# Patient Record
Sex: Female | Born: 1935 | Race: White | Hispanic: No | Marital: Married | Smoking: Never smoker
Health system: Southern US, Community
[De-identification: ages and names within clinical notes are randomized; demographics above are authoritative.]

## PROBLEM LIST (undated history)

## (undated) DIAGNOSIS — M5431 Sciatica, right side: Secondary | ICD-10-CM

## (undated) DIAGNOSIS — R079 Chest pain, unspecified: Secondary | ICD-10-CM

## (undated) DIAGNOSIS — K219 Gastro-esophageal reflux disease without esophagitis: Secondary | ICD-10-CM

## (undated) DIAGNOSIS — M4800 Spinal stenosis, site unspecified: Secondary | ICD-10-CM

## (undated) HISTORY — PX: APPENDECTOMY: SHX54

## (undated) HISTORY — PX: SKIN CANCER EXCISION: SHX779

---

## 2000-03-27 ENCOUNTER — Ambulatory Visit (HOSPITAL_COMMUNITY): Admission: RE | Admit: 2000-03-27 | Discharge: 2000-03-27 | Payer: Self-pay | Admitting: Gynecology

## 2000-03-27 ENCOUNTER — Encounter (INDEPENDENT_AMBULATORY_CARE_PROVIDER_SITE_OTHER): Payer: Self-pay | Admitting: Specialist

## 2000-04-01 ENCOUNTER — Ambulatory Visit: Admission: RE | Admit: 2000-04-01 | Discharge: 2000-04-01 | Payer: Self-pay | Admitting: Gynecology

## 2000-04-04 ENCOUNTER — Encounter: Payer: Self-pay | Admitting: Gynecology

## 2000-04-08 ENCOUNTER — Inpatient Hospital Stay (HOSPITAL_COMMUNITY): Admission: RE | Admit: 2000-04-08 | Discharge: 2000-04-11 | Payer: Self-pay | Admitting: Gynecology

## 2000-04-08 ENCOUNTER — Encounter (INDEPENDENT_AMBULATORY_CARE_PROVIDER_SITE_OTHER): Payer: Self-pay

## 2000-04-09 ENCOUNTER — Encounter (INDEPENDENT_AMBULATORY_CARE_PROVIDER_SITE_OTHER): Payer: Self-pay

## 2000-07-22 ENCOUNTER — Encounter: Admission: RE | Admit: 2000-07-22 | Discharge: 2000-07-22 | Payer: Self-pay | Admitting: Gynecology

## 2000-07-22 ENCOUNTER — Encounter: Payer: Self-pay | Admitting: Gynecology

## 2000-08-05 ENCOUNTER — Other Ambulatory Visit: Admission: RE | Admit: 2000-08-05 | Discharge: 2000-08-05 | Payer: Self-pay | Admitting: Gynecology

## 2000-08-26 HISTORY — PX: ABDOMINAL HYSTERECTOMY: SHX81

## 2000-10-08 ENCOUNTER — Other Ambulatory Visit: Admission: RE | Admit: 2000-10-08 | Discharge: 2000-10-08 | Payer: Self-pay | Admitting: Gynecology

## 2000-10-29 ENCOUNTER — Encounter: Payer: Self-pay | Admitting: Gynecology

## 2000-10-29 ENCOUNTER — Encounter: Admission: RE | Admit: 2000-10-29 | Discharge: 2000-10-29 | Payer: Self-pay | Admitting: Gynecology

## 2001-02-02 ENCOUNTER — Other Ambulatory Visit: Admission: RE | Admit: 2001-02-02 | Discharge: 2001-02-02 | Payer: Self-pay | Admitting: Gynecology

## 2001-04-13 ENCOUNTER — Other Ambulatory Visit: Admission: RE | Admit: 2001-04-13 | Discharge: 2001-04-13 | Payer: Self-pay | Admitting: Gynecology

## 2001-07-13 ENCOUNTER — Other Ambulatory Visit: Admission: RE | Admit: 2001-07-13 | Discharge: 2001-07-13 | Payer: Self-pay | Admitting: Gynecology

## 2001-11-02 ENCOUNTER — Encounter: Admission: RE | Admit: 2001-11-02 | Discharge: 2001-11-02 | Payer: Self-pay | Admitting: Gynecology

## 2001-11-02 ENCOUNTER — Encounter: Payer: Self-pay | Admitting: Gynecology

## 2001-11-23 ENCOUNTER — Ambulatory Visit (HOSPITAL_COMMUNITY): Admission: RE | Admit: 2001-11-23 | Discharge: 2001-11-23 | Payer: Self-pay | Admitting: Gynecology

## 2001-11-23 ENCOUNTER — Encounter: Payer: Self-pay | Admitting: Gynecology

## 2002-03-25 ENCOUNTER — Other Ambulatory Visit: Admission: RE | Admit: 2002-03-25 | Discharge: 2002-03-25 | Payer: Self-pay | Admitting: Gynecology

## 2002-03-26 ENCOUNTER — Encounter: Admission: RE | Admit: 2002-03-26 | Discharge: 2002-03-26 | Payer: Self-pay | Admitting: Gynecology

## 2002-03-26 ENCOUNTER — Encounter: Payer: Self-pay | Admitting: Gynecology

## 2002-07-30 ENCOUNTER — Other Ambulatory Visit: Admission: RE | Admit: 2002-07-30 | Discharge: 2002-07-30 | Payer: Self-pay | Admitting: Gynecology

## 2002-08-13 ENCOUNTER — Encounter: Payer: Self-pay | Admitting: Gynecology

## 2002-08-13 ENCOUNTER — Encounter: Admission: RE | Admit: 2002-08-13 | Discharge: 2002-08-13 | Payer: Self-pay | Admitting: Gynecology

## 2003-08-17 ENCOUNTER — Other Ambulatory Visit: Admission: RE | Admit: 2003-08-17 | Discharge: 2003-08-17 | Payer: Self-pay | Admitting: Gynecology

## 2003-08-22 ENCOUNTER — Encounter: Admission: RE | Admit: 2003-08-22 | Discharge: 2003-08-22 | Payer: Self-pay | Admitting: Gynecology

## 2003-08-25 ENCOUNTER — Encounter: Admission: RE | Admit: 2003-08-25 | Discharge: 2003-08-25 | Payer: Self-pay | Admitting: Gynecology

## 2004-10-10 ENCOUNTER — Other Ambulatory Visit: Admission: RE | Admit: 2004-10-10 | Discharge: 2004-10-10 | Payer: Self-pay | Admitting: Gynecology

## 2004-10-23 ENCOUNTER — Encounter: Admission: RE | Admit: 2004-10-23 | Discharge: 2004-10-23 | Payer: Self-pay | Admitting: Gynecology

## 2005-12-10 ENCOUNTER — Encounter: Admission: RE | Admit: 2005-12-10 | Discharge: 2005-12-10 | Payer: Self-pay | Admitting: Gynecology

## 2005-12-17 ENCOUNTER — Other Ambulatory Visit: Admission: RE | Admit: 2005-12-17 | Discharge: 2005-12-17 | Payer: Self-pay | Admitting: Gynecology

## 2007-01-05 ENCOUNTER — Encounter: Admission: RE | Admit: 2007-01-05 | Discharge: 2007-01-05 | Payer: Self-pay | Admitting: Gynecology

## 2007-01-30 ENCOUNTER — Other Ambulatory Visit: Admission: RE | Admit: 2007-01-30 | Discharge: 2007-01-30 | Payer: Self-pay | Admitting: Gynecology

## 2008-02-04 ENCOUNTER — Encounter: Admission: RE | Admit: 2008-02-04 | Discharge: 2008-02-04 | Payer: Self-pay | Admitting: Gynecology

## 2009-02-14 ENCOUNTER — Encounter: Admission: RE | Admit: 2009-02-14 | Discharge: 2009-02-14 | Payer: Self-pay | Admitting: Gynecology

## 2010-04-04 ENCOUNTER — Encounter: Admission: RE | Admit: 2010-04-04 | Discharge: 2010-04-04 | Payer: Self-pay | Admitting: Gynecology

## 2011-01-11 NOTE — Op Note (Signed)
[facility]  Patient:    Jade Bishop, Jade Bishop                   MRN: [id number] Proc. Date: 04/08/00 Adm. Date:  [id number] Disc. Date: [id number] Attending:  Katrina Stack CC:         Gretta Cool, M.D.  Telford Nab, R.N.   Operative Report  PREOPERATIVE DIAGNOSIS:       Anaplastic endometrial carcinoma.  POSTOPERATIVE DIAGNOSIS:      Anaplastic endometrial carcinoma.  OPERATION:                    Exploratory laparotomy, total abdominal                               hysterectomy, pelvic and periaortic                               lymphadenectomy.  SURGEON:                      Daniel L. Clarke-Pearson, M.D.                               Gretta Cool, M.D.  ANESTHESIA:                   General endotracheal tube  ESTIMATED BLOOD LOSS:         200 cc  SURGICAL FINDINGS:            A exploratory laparotomy, the upper abdomen including the diaphragm, liver, stomach, spleen, omentum, small and large bowel all appear normal.  The patient had previously had an appendectomy.  The pelvic and periaortic lymph nodes appeared normal in size.  The uterus was essentially normal in size. The tubes and ovaries appeared normal as well. On frozen section the pathologist was unable to identify any residual cancer in two frozen section slides of the uterus revealed no evidence of invasion.  DESCRIPTION OF PROCEDURE:     The patient was brought to the operating room and after satisfactory obtainment of general anesthesia was placed in a modified lithotomy position in [place] stirrups.  The anterior abdominal wall, perineum and vagina were prepped with Betadine.  A Foley catheter was placed and the patient was draped.  The abdomen was entered through a midline incision. Peritoneal washings were obtained from the pelvis.  The upper abdomen was explored with the above noted findings.  The Buchwalter retractor was positioned and the bowel was packed  out of the pelvis.  There were some adhesions from the left pelvic side wall to the uterus which were lysed with sharp dissection. The round ligaments were divided and the retroperitoneal space was opened, identifying the external iliac artery and vein, internal iliac artery and ureter.  The ovarian vessels were skeletonized, clamped, cut, suture ligated and free tied.  The bladder flap was advanced with sharp and blunt dissection. The uterine vessels were skeletonized.  Then clamp, cut and suture ligated.  The rectovaginal septum was developed.  In a step-wise fashion, the paracervical and cardinal ligaments were clamped, cut and suture ligated incorporating the uterosacral ligament with a clamp placed across the vaginal angles.  The vagina was transected from its connection to the cervix. The uterus, cervix, tubes  and ovaries were handed off the operative field as a single specimen and submitted to frozen section with the above noted findings. Vaginal angles were transfixed and central portion of the vaginal cuff including the rectovaginal septum was closed with interrupted figure-of-eight sutures of 0 Vicryl.  Attention was turned to the pelvic lymphadenopathy.  The lymph nodes were harvested from the external iliac artery and vein down into the obturator space.  Throughout the dissection the genitofemoral nerve and the obturator nerve were identified and protected.  Samples were sent. Lymphadenectomy was performed of the external iliac, obturator and hypogastric groups.  Hemostasis was achieved with the cautery and hemoclips.  A similar procedure was performed on both sides of the pelvis.  Buchwalter retractor was repositioned.  The small bowel was mobilized to the right and a peritoneal incision was made overlying the right common iliac artery and along the aorta.  The right ureter was identified and reflected laterally.  The duodenum was identified and mobilized cephalad so that  the dissection proceeded to approximately 5 cm above the inferior mesenteric artery.  Lymphadenectomy was then performed removing lymph nodes from the right common iliac artery and vein, vena cava and aorta.  The inferior mesenteric artery was protected throughout the dissection.  Hemostasis was achieved with electrocautery and Hemoclips.  The periaortic dissection was reinspected.  Hemostasis was adequate.  The pelvis was reinspected.  Additional hemostasis was achieved with Hemoclips and cautery.  Pelvis and abdomen were irrigated with copious amounts of warm saline.  The retractors and packed were removed and the anterior abdominal wall closed in layers, the first being a running Smead-Jones closure using #1 PDS.  Subcutaneous tissue was irrigated. Hemostasis achieved with cautery and the skin was closed with skin staples.  Dressing was applied.  The patient was awakened from anesthesia and taken to the recovery room in satisfactory condition. Sponge, needle and instrument counts times two. DD:  04/08/00 TD:  04/08/00 Job: [id number]
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
## 2011-01-11 NOTE — Op Note (Signed)
[facility]  Patient:    Jade Bishop, Jade Bishop                         MRN: [id number] Proc. Date: 03/27/00 Attending:  Gretta Cool, M.D.                           Operative Report  PREOPERATIVE DIAGNOSES: 1. Postmenopausal bleeding. 2. Enormous endometrial polyp versus fibroid by ultrasound.  POSTOPERATIVE DIAGNOSIS:  Two large endometrial polyps attached to the anterior uterine wall.  PROCEDURE:  Hysteroscopy, resection of enormous endometrial polyps, approximately 3 to 4 cm in maximum dimension, total endometrial resection for ablation.  SURGEON:  Gretta Cool, M.D.  ANESTHESIA:  IV sedation and paracervical block.  DESCRIPTION OF PROCEDURE:  Under excellent IV sedation with paracervical block, the cervix was progressively dilated to accommodate a 7 mm resectoscope. The endometrial cavity was then photographed and a very large, approximately silver dollar-sized polyp was noted.  It had very large vessels coursing across it.  The tissue was soft and mushy.  A secondary polyp was noted from the right cornual area.  The resection of the smaller polyp was undertaken first.  Next, the entire enormous endometrial polyp was resected progressively until the entire polyp had been removed to its base.  Its attachment was on the anterior uterine wall.  It had filmy adhesions to the entire uterine wall. Once the polyp was entirely resected and its base resected down into the myometrium, the entire remainder of the endometrial cavity was resected and then the cavity treated by VaporTrode so as to sterilize the endometrial cavity and prevent recurrent polyp formation.  At this point, the procedure was terminated without complication.  The patient returned to the recovery room in excellent condition.  Fluid deficit was approximately 100 cc.  Complications none.  The patient tolerated the procedure without discomfort. DD:  03/27/00 TD:  03/27/00 Job:  [id number]

## 2011-01-11 NOTE — Consult Note (Signed)
[facility]  Patient:    Jade Bishop, Jade Bishop                         MRN: [id number] Adm. Date:  [id number] Attending:  Jeannette Corpus CC:         Gretta Cool, M.D.  Telford Nab, R.N.   Consultation Report  HISTORY OF PRESENT ILLNESS:  A 75 year old white female referred by Gretta Cool, M.D. for evaluation and management of a newly diagnosed anaplastic carcinoma of the endometrium. The patient initially presented to Gretta Cool, M.D. with a vaginal discharge which was pink in nature. She ultimately underwent a D&C on August 2 which returned showing an anaplastic carcinoma which is thought most likely to be an adenosquamous carcinoma although a carcinosarcoma could not be entirely excluded.  The patient has no significant past gynecologic history.  MEDICAL ILLNESSES:  Migraine headaches.  PAST SURGICAL HISTORY:  D&C, exploratory laparotomy with appendectomy for postpartum peritonitis.  CURRENT MEDICATIONS:  None. The patient has never taken any hormone replacement therapy.  FAMILY HISTORY:  Negative for gynecologic, breast, or colon cancers.  SOCIAL HISTORY:  The patient is married. She has three living children. She is retired. She does not smoke.  REVIEW OF SYSTEMS:  Essentially negative, except for a vaginal discharge.  PHYSICAL EXAMINATION:  VITAL SIGNS:  Height 5 feet 2 inches, weight 176 pounds, blood pressure 110/60, pulse 60, respiratory rate 24.  GENERAL:  The patient is a pleasant, white female in no acute distress.  HEENT:  Negative.  NECK:  Supple without thyromegaly.  LYMPH NODES:  No supraclavicular, axillary, or inguinal adenopathy.  ABDOMEN:  Soft, nontender. No mass, organomegaly, ascites, or hernias are noted.  PELVIC:  EG and BUS normal. Vagina has some mucoid discharge present mixed with blood. The uterus is anterior and upper limits normal size. No adnexal mass is noted. Rectovaginal  confirms.  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  DISPOSITION:  I would recommend the patient undergo exploratory laparotomy with total abdominal hysterectomy, bilateral salpingo-oophorectomy, surgical staging to include pelvic and periaortic lymphadenectomy. I had a lengthy discussion with the patient and her husband regarding this management, and the possibility that she would require postoperative radiation therapy and/or chemotherapy. She wishes to proceed with surgery which is scheduled for next week. The risks of surgery including hemorrhage, infection, injury to adjacent viscera, and thromboembolic complications, and anesthetic risks were all outlined to the patient. All of her questions are answered. She wishes to proceed with surgery as scheduled in conjunction with Gretta Cool, M.D. DD:  04/01/00 TD:  04/01/00 Job: [id number]

## 2011-01-11 NOTE — Discharge Summary (Signed)
[facility]  Patient:    Jade Bishop, Jade Bishop                         MRN: [id number] Adm. Date:  04/08/00 Disc. Date: 04/11/00 Attending:  Gretta Cool, M.D. Dictator:   Jeani Sow, RN, FNP CC:         Rande Brunt. Clarke-Pearson, M.D.   Discharge Summary  HISTORY OF PRESENT ILLNESS:  Ms. Offutt is a 75 year old white married female, gravida 3, para 3, who is admitted for definitive therapy of anaplastic carcinoma of the uterus.  She presented to our office with the complaint of yellowish vaginal discharge in May 2001.  Ultrasound examination revealed a very thick endometrium and a questionable endometrial polyp.  She underwent D&C and hysteroscopy as an outpatient at [facility]. Surgery was postponed due to a death in her family, and the procedure was performed on August 2, which showed adenocarcinoma, squamous carcinoma versus carcinoma sarcoma.  Due to the exceedingly poor differentiation, decision could not be made with certainty that it was a necrotic endometrial polyp versus the above findings.  At this point, it was decided to proceed with exploratory laparotomy, hysterectomy, bilateral salpingo-oophorectomy, node dissection, pelvic and periaortic lymphadenectomy.  She is now admitted for these procedures.  Risks and benefits were discussed with the patient.  ADMISSION PHYSICAL EXAMINATION:  CHEST:  Clear to auscultation and percussion.  HEART:  Regular rate and rhythm without murmur, gallop, or cardiac enlargement.  ABDOMEN:  Soft without masses or organomegaly.  PELVIC:  External genitalia within normal limits, female vagina clean with significant atrophy.  Cervix is parous.  Uterus upper normal limit of size. Adnexa bilaterally clear.  Rectovaginal exam confirms.  IMPRESSION:  Anaplastic carcinoma of the endometrium in a menopausal woman without hormone replacement therapy.  PROCEDURES:  As mentioned above.  LABORATORY DATA:   Admission hemoglobin 13.7, hematocrit 38.7, and a white count of 6.2.  On the first postoperative day, hemoglobin was 11.7, hematocrit 32.9, and a white count of 8.2.  Admission glucose was 141.  Blood type O positive with a negative antibody screen.  Chest x-ray with heart size prominent, no active disease.  HOSPITAL COURSE:  The patient underwent exploratory laparotomy, total abdominal hysterectomy, pelvic and periaortic lymphadenectomy under general anesthesia.  The procedures were performed by Dr. De Blanch and Dr. Beather Arbour, and they were completed without any complications. Pathology report revealed extensive ulceration, hemorrhage, and foreign body reaction consistent with recent hysteroscopic procedure of the endometrium, cervix with no pathologic abnormalities, myometrium and serosa with no pathological abnormalities, bilateral ovaries and tubes with no abnormalities identified.  All nodes were benign.  Her postoperative course was without complications, and the patient was discharged on the third postoperative day in excellent condition.  DISCHARGE INSTRUCTIONS:  No heavy lifting or straining, no vaginal entrance, increase ambulation as tolerated.  She is to call for any fever of over 105 or failure of daily improvement.  DIET:  Regular.  DISCHARGE MEDICATIONS:  Vioxx 25 mg 1 p.o. q.12-24h. p.r.n.  FOLLOW-UP:  She is to return to the office in one week for follow-up.  CONDITION ON DISCHARGE:  Excellent.  FINAL DISCHARGE DIAGNOSES:  Anaplastic endometrial carcinoma.  PROCEDURES PERFORMED:  Exploratory laparotomy, total abdominal hysterectomy, pelvic and periaortic lymphadenectomy under general anesthesia. DD:  05/12/00 TD:  05/13/00 Job: [id number]

## 2011-04-17 ENCOUNTER — Other Ambulatory Visit: Payer: Self-pay | Admitting: Gynecology

## 2011-04-17 DIAGNOSIS — Z1231 Encounter for screening mammogram for malignant neoplasm of breast: Secondary | ICD-10-CM

## 2011-04-25 ENCOUNTER — Ambulatory Visit
Admission: RE | Admit: 2011-04-25 | Discharge: 2011-04-25 | Disposition: A | Discharge status: Home / Self Care | Payer: Medicare Other | Source: Ambulatory Visit | Attending: Gynecology | Admitting: Gynecology

## 2011-04-25 DIAGNOSIS — Z1231 Encounter for screening mammogram for malignant neoplasm of breast: Secondary | ICD-10-CM

## 2011-06-04 ENCOUNTER — Other Ambulatory Visit: Payer: Self-pay | Admitting: Gynecology

## 2012-05-07 ENCOUNTER — Other Ambulatory Visit: Payer: Self-pay | Admitting: Gynecology

## 2012-05-07 DIAGNOSIS — Z1231 Encounter for screening mammogram for malignant neoplasm of breast: Secondary | ICD-10-CM

## 2012-06-01 ENCOUNTER — Ambulatory Visit: Payer: Medicare Other

## 2012-06-03 ENCOUNTER — Ambulatory Visit: Payer: Medicare Other

## 2012-06-08 ENCOUNTER — Other Ambulatory Visit: Payer: Self-pay | Admitting: Gynecology

## 2012-06-18 ENCOUNTER — Ambulatory Visit: Payer: Medicare Other

## 2012-07-29 ENCOUNTER — Ambulatory Visit
Admission: RE | Admit: 2012-07-29 | Discharge: 2012-07-29 | Disposition: A | Discharge status: Home / Self Care | Payer: Medicare Other | Source: Ambulatory Visit | Attending: Gynecology | Admitting: Gynecology

## 2012-07-29 DIAGNOSIS — Z1231 Encounter for screening mammogram for malignant neoplasm of breast: Secondary | ICD-10-CM

## 2013-08-13 ENCOUNTER — Other Ambulatory Visit: Payer: Self-pay

## 2013-08-13 DIAGNOSIS — Z1231 Encounter for screening mammogram for malignant neoplasm of breast: Secondary | ICD-10-CM

## 2013-08-24 ENCOUNTER — Ambulatory Visit
Admission: RE | Admit: 2013-08-24 | Discharge: 2013-08-24 | Disposition: A | Discharge status: Home / Self Care | Payer: Medicare HMO | Source: Ambulatory Visit

## 2013-08-24 DIAGNOSIS — Z1231 Encounter for screening mammogram for malignant neoplasm of breast: Secondary | ICD-10-CM

## 2013-09-13 ENCOUNTER — Other Ambulatory Visit (HOSPITAL_COMMUNITY): Payer: Self-pay | Admitting: Neurosurgery

## 2013-09-13 ENCOUNTER — Ambulatory Visit (HOSPITAL_COMMUNITY)
Admission: RE | Admit: 2013-09-13 | Discharge: 2013-09-13 | Disposition: A | Discharge status: Home / Self Care | Payer: Medicare HMO | Source: Ambulatory Visit | Attending: Neurosurgery | Admitting: Neurosurgery

## 2013-09-13 DIAGNOSIS — Q762 Congenital spondylolisthesis: Secondary | ICD-10-CM

## 2013-09-13 DIAGNOSIS — M79609 Pain in unspecified limb: Secondary | ICD-10-CM

## 2013-09-13 NOTE — Progress Notes (Signed)
*  Preliminary Results* Bilateral lower extremity venous duplex completed. Bilateral lower extremities are negative for deep vein thrombosis. There is no evidence of Baker's cyst bilaterally.  09/13/2013  Gertie FeyMichelle Zlatan Hornback, RVT, RDCS, RDMS

## 2014-05-04 ENCOUNTER — Other Ambulatory Visit: Payer: Self-pay | Admitting: Dermatology

## 2014-09-08 ENCOUNTER — Other Ambulatory Visit: Payer: Self-pay

## 2014-09-08 DIAGNOSIS — Z1231 Encounter for screening mammogram for malignant neoplasm of breast: Secondary | ICD-10-CM

## 2014-09-15 ENCOUNTER — Ambulatory Visit
Admission: RE | Admit: 2014-09-15 | Discharge: 2014-09-15 | Disposition: A | Discharge status: Home / Self Care | Payer: Commercial Managed Care - HMO | Source: Ambulatory Visit

## 2014-09-15 DIAGNOSIS — Z1231 Encounter for screening mammogram for malignant neoplasm of breast: Secondary | ICD-10-CM

## 2015-08-09 ENCOUNTER — Other Ambulatory Visit: Payer: Self-pay

## 2015-08-09 DIAGNOSIS — Z1231 Encounter for screening mammogram for malignant neoplasm of breast: Secondary | ICD-10-CM

## 2015-09-18 ENCOUNTER — Ambulatory Visit
Admission: RE | Admit: 2015-09-18 | Discharge: 2015-09-18 | Disposition: A | Discharge status: Home / Self Care | Payer: Commercial Managed Care - HMO | Source: Ambulatory Visit

## 2015-09-18 DIAGNOSIS — Z1231 Encounter for screening mammogram for malignant neoplasm of breast: Secondary | ICD-10-CM

## 2016-07-03 ENCOUNTER — Encounter (HOSPITAL_COMMUNITY): Payer: Self-pay | Admitting: Emergency Medicine

## 2016-07-03 ENCOUNTER — Emergency Department (HOSPITAL_COMMUNITY): Payer: Commercial Managed Care - HMO

## 2016-07-03 ENCOUNTER — Observation Stay (HOSPITAL_COMMUNITY)
Admission: EM | Admit: 2016-07-03 | Discharge: 2016-07-04 | Disposition: A | Discharge status: Home / Self Care | Payer: Commercial Managed Care - HMO | Attending: Internal Medicine | Admitting: Internal Medicine

## 2016-07-03 DIAGNOSIS — R600 Localized edema: Secondary | ICD-10-CM | POA: Insufficient documentation

## 2016-07-03 DIAGNOSIS — K210 Gastro-esophageal reflux disease with esophagitis: Secondary | ICD-10-CM | POA: Diagnosis not present

## 2016-07-03 DIAGNOSIS — R1310 Dysphagia, unspecified: Secondary | ICD-10-CM

## 2016-07-03 DIAGNOSIS — R071 Chest pain on breathing: Secondary | ICD-10-CM | POA: Diagnosis present

## 2016-07-03 DIAGNOSIS — Z79899 Other long term (current) drug therapy: Secondary | ICD-10-CM | POA: Diagnosis not present

## 2016-07-03 DIAGNOSIS — R079 Chest pain, unspecified: Secondary | ICD-10-CM | POA: Diagnosis not present

## 2016-07-03 DIAGNOSIS — Z7982 Long term (current) use of aspirin: Secondary | ICD-10-CM | POA: Insufficient documentation

## 2016-07-03 DIAGNOSIS — K219 Gastro-esophageal reflux disease without esophagitis: Secondary | ICD-10-CM

## 2016-07-03 DIAGNOSIS — M791 Myalgia: Secondary | ICD-10-CM

## 2016-07-03 DIAGNOSIS — R739 Hyperglycemia, unspecified: Secondary | ICD-10-CM

## 2016-07-03 DIAGNOSIS — M7918 Myalgia, other site: Secondary | ICD-10-CM

## 2016-07-03 HISTORY — DX: Gastro-esophageal reflux disease without esophagitis: K21.9

## 2016-07-03 HISTORY — DX: Sciatica, right side: M54.31

## 2016-07-03 HISTORY — DX: Spinal stenosis, site unspecified: M48.00

## 2016-07-03 HISTORY — DX: Chest pain, unspecified: R07.9

## 2016-07-03 LAB — I-STAT TROPONIN, ED
TROPONIN I, POC: 0 ng/mL (ref 0.00–0.08)
TROPONIN I, POC: 0 ng/mL (ref 0.00–0.08)

## 2016-07-03 LAB — I-STAT CHEM 8, ED
BUN: 13 mg/dL (ref 6–20)
CALCIUM ION: 1.13 mmol/L — AB (ref 1.15–1.40)
CHLORIDE: 104 mmol/L (ref 101–111)
CREATININE: 0.9 mg/dL (ref 0.44–1.00)
GLUCOSE: 116 mg/dL — AB (ref 65–99)
HCT: 41 % (ref 36.0–46.0)
Hemoglobin: 13.9 g/dL (ref 12.0–15.0)
Potassium: 3.9 mmol/L (ref 3.5–5.1)
Sodium: 143 mmol/L (ref 135–145)
TCO2: 27 mmol/L (ref 0–100)

## 2016-07-03 LAB — TROPONIN I
Troponin I: <0.03 ng/mL (ref <0.03)
Troponin I: <0.03 ng/mL (ref <0.03)

## 2016-07-03 LAB — COMPREHENSIVE METABOLIC PANEL
ALBUMIN: 3.8 g/dL (ref 3.5–5.0)
ALT: 27 U/L (ref 14–54)
AST: 30 U/L (ref 15–41)
Alkaline Phosphatase: 62 U/L (ref 38–126)
Anion gap: 9 (ref 5–15)
BILIRUBIN TOTAL: 0.8 mg/dL (ref 0.3–1.2)
BUN: 12 mg/dL (ref 6–20)
CO2: 26 mmol/L (ref 22–32)
Calcium: 9.4 mg/dL (ref 8.9–10.3)
Chloride: 107 mmol/L (ref 101–111)
Creatinine, Ser: 0.89 mg/dL (ref 0.44–1.00)
GFR calc Af Amer: >60 mL/min (ref >60)
GFR calc non Af Amer: >60 mL/min (ref >60)
GLUCOSE: 123 mg/dL — AB (ref 65–99)
POTASSIUM: 3.9 mmol/L (ref 3.5–5.1)
SODIUM: 142 mmol/L (ref 135–145)
TOTAL PROTEIN: 6.5 g/dL (ref 6.5–8.1)

## 2016-07-03 LAB — CBC
HCT: 41.2 % (ref 36.0–46.0)
HEMOGLOBIN: 14 g/dL (ref 12.0–15.0)
MCH: 30.3 pg (ref 26.0–34.0)
MCHC: 34 g/dL (ref 30.0–36.0)
MCV: 89.2 fL (ref 78.0–100.0)
Platelets: 212 10*3/uL (ref 150–400)
RBC: 4.62 MIL/uL (ref 3.87–5.11)
RDW: 12.9 % (ref 11.5–15.5)
WBC: 12.1 10*3/uL — ABNORMAL HIGH (ref 4.0–10.5)

## 2016-07-03 MED ORDER — ONDANSETRON HCL 4 MG/2ML IJ SOLN: 4.0000 mg | Freq: Four times a day (QID) | INTRAMUSCULAR | Status: DC | PRN

## 2016-07-03 MED ORDER — MORPHINE SULFATE (PF) 4 MG/ML IV SOLN
4.0000 mg | Freq: Once | INTRAVENOUS | Status: AC
  Administered 2016-07-03: 4 mg via INTRAVENOUS
  Filled 2016-07-03: qty 1

## 2016-07-03 MED ORDER — ACETAMINOPHEN 650 MG RE SUPP: 650.0000 mg | Freq: Four times a day (QID) | RECTAL | Status: DC | PRN

## 2016-07-03 MED ORDER — IOPAMIDOL (ISOVUE-370) INJECTION 76%
INTRAVENOUS | Status: AC
  Administered 2016-07-03: 100 mL
  Filled 2016-07-03: qty 100

## 2016-07-03 MED ORDER — METHOCARBAMOL 1000 MG/10ML IJ SOLN
500.0000 mg | Freq: Four times a day (QID) | INTRAVENOUS | Status: DC | PRN
  Filled 2016-07-03: qty 5

## 2016-07-03 MED ORDER — KETOTIFEN FUMARATE 0.025 % OP SOLN
1.0000 [drp] | Freq: Two times a day (BID) | OPHTHALMIC | Status: DC | PRN
  Filled 2016-07-03: qty 5

## 2016-07-03 MED ORDER — ONDANSETRON HCL 4 MG PO TABS: 4.0000 mg | ORAL_TABLET | Freq: Four times a day (QID) | ORAL | Status: DC | PRN

## 2016-07-03 MED ORDER — HEPARIN SODIUM (PORCINE) 5000 UNIT/ML IJ SOLN
5000.0000 [IU] | Freq: Three times a day (TID) | INTRAMUSCULAR | Status: DC
  Administered 2016-07-03 – 2016-07-04 (×2): 5000 [IU] via SUBCUTANEOUS
  Filled 2016-07-03 (×2): qty 1

## 2016-07-03 MED ORDER — METHOCARBAMOL 500 MG PO TABS
500.0000 mg | ORAL_TABLET | Freq: Four times a day (QID) | ORAL | Status: DC | PRN
  Administered 2016-07-03: 500 mg via ORAL
  Filled 2016-07-03: qty 1

## 2016-07-03 MED ORDER — DICLOFENAC SODIUM 1 % TD GEL
4.0000 g | Freq: Four times a day (QID) | TRANSDERMAL | Status: DC | PRN
  Filled 2016-07-03: qty 100

## 2016-07-03 MED ORDER — GI COCKTAIL ~~LOC~~
30.0000 mL | Freq: Once | ORAL | Status: AC
  Administered 2016-07-03: 30 mL via ORAL
  Filled 2016-07-03: qty 30

## 2016-07-03 MED ORDER — GI COCKTAIL ~~LOC~~
30.0000 mL | Freq: Four times a day (QID) | ORAL | Status: DC | PRN
  Administered 2016-07-04: 30 mL via ORAL
  Filled 2016-07-03: qty 30

## 2016-07-03 MED ORDER — SODIUM CHLORIDE 0.45 % IV SOLN
INTRAVENOUS | Status: DC
  Administered 2016-07-03: 16:00:00 via INTRAVENOUS

## 2016-07-03 MED ORDER — POLYVINYL ALCOHOL 1.4 % OP SOLN
1.0000 [drp] | Freq: Four times a day (QID) | OPHTHALMIC | Status: DC | PRN
  Filled 2016-07-03: qty 15

## 2016-07-03 MED ORDER — ASPIRIN 325 MG PO TABS
325.0000 mg | ORAL_TABLET | Freq: Every day | ORAL | Status: DC
  Administered 2016-07-04: 325 mg via ORAL
  Filled 2016-07-03: qty 1

## 2016-07-03 MED ORDER — ALPRAZOLAM 0.25 MG PO TABS: 0.2500 mg | ORAL_TABLET | Freq: Two times a day (BID) | ORAL | Status: DC | PRN

## 2016-07-03 MED ORDER — SODIUM CHLORIDE 0.9% FLUSH
3.0000 mL | Freq: Two times a day (BID) | INTRAVENOUS | Status: DC
  Administered 2016-07-03: 3 mL via INTRAVENOUS

## 2016-07-03 MED ORDER — ACETAMINOPHEN 325 MG PO TABS: 650.0000 mg | ORAL_TABLET | Freq: Four times a day (QID) | ORAL | Status: DC | PRN

## 2016-07-03 NOTE — ED Notes (Signed)
[facility] called and said that they are unable to do stress test on patient today due to staffing and doctor wanting to see more troponins drawn prior to doing stress test.  They will be able to do stress test tomorrow.  KEEP PATIENT NPO AFTER MIDNIGHT TONIGHT.

## 2016-07-03 NOTE — Consult Note (Signed)
[facility] Consult Note  Referring Provider: No ref. provider found Primary Care Physician:  Melinda Crutch, MD Primary Gastroenterologist:  Dr.  Laurel Dimmer Complaint: Chest pain HPI: Jade Bishop is an 80 y.o. white female  who awoke at 5 AM with crushing substernal chest pain radiating up into the left arm and neck. Had somewhat of a pleuritic component to it and only slightly resembled brief attacks of what she calls reflux. Came to the emergency room had normal troponin and EKG. Was given a GI cocktail with minimal relief but pain later began to subside after a few hours. She had chest CT to rule out PE or aortic dissection this revealed no definite abnormality. She states she has history of reflux and does not take anything regularly for it other than an acids. She also complains a lot of burping and has some intermittent stable solid food dysphagia with occasional forced regurgitation occurring about once a month. She's had a colonoscopy about 9 years ago and was recently called by her gastroenterologist to set up another one but he is not except her insurance.  Past Medical History:  Diagnosis Date  . GERD (gastroesophageal reflux disease)   . Sciatica, right side   . Spinal stenosis     Past Surgical History:  Procedure Laterality Date  . ABDOMINAL HYSTERECTOMY  2002  . APPENDECTOMY    . SKIN CANCER EXCISION     located on abdominal wall     (Not in a hospital admission)  Allergies: Not on File  Family History  Problem Relation Age of Onset  . Stroke Mother 84    Social History:  reports that she has never smoked. She has never used smokeless tobacco. She reports that she does not drink alcohol or use drugs.  Review of Systems: negative except As above  Blood pressure 120/55, pulse 79, temperature 98.7 F (37.1 C), resp. rate 11, height _0  (1.575 m), weight 81.6 kg (180 lb), SpO2 94 %. Head: Normocephalic, without obvious abnormality, atraumatic Neck: no  adenopathy, no carotid bruit, no JVD, supple, symmetrical, trachea midline and thyroid not enlarged, symmetric, no tenderness/mass/nodules Resp: clear to auscultation bilaterally Cardio: regular rate and rhythm, S1, S2 normal, no murmur, click, rub or gallop GI: Abdomen soft nondistended with normoactive bowel sounds. splenomegaly mass or guarding  Extremities: extremities normal, atraumatic, no cyanosis or edema  Results for orders placed or performed during the hospital encounter of 07/03/16 (from the past 48 hour(s))  CBC     Status: Abnormal   Collection Time: 07/03/16  8:45 AM  Result Value Ref Range   WBC 12.1 (H) 4.0 - 10.5 K/uL   RBC 4.62 3.87 - 5.11 MIL/uL   Hemoglobin 14.0 12.0 - 15.0 g/dL   HCT 41.2 36.0 - 46.0 %   MCV 89.2 78.0 - 100.0 fL   MCH 30.3 26.0 - 34.0 pg   MCHC 34.0 30.0 - 36.0 g/dL   RDW 12.9 11.5 - 15.5 %   Platelets 212 150 - 400 K/uL  Comprehensive metabolic panel     Status: Abnormal   Collection Time: 07/03/16  8:45 AM  Result Value Ref Range   Sodium 142 135 - 145 mmol/L   Potassium 3.9 3.5 - 5.1 mmol/L   Chloride 107 101 - 111 mmol/L   CO2 26 22 - 32 mmol/L   Glucose, Bld 123 (H) 65 - 99 mg/dL   BUN 12 6 - 20 mg/dL   Creatinine, Ser 0.89 0.44 - 1.00 mg/dL  Calcium 9.4 8.9 - 10.3 mg/dL   Total Protein 6.5 6.5 - 8.1 g/dL   Albumin 3.8 3.5 - 5.0 g/dL   AST 30 15 - 41 U/L   ALT 27 14 - 54 U/L   Alkaline Phosphatase 62 38 - 126 U/L   Total Bilirubin 0.8 0.3 - 1.2 mg/dL   GFR calc non Af Amer >60 >60 mL/min   GFR calc Af Amer >60 >60 mL/min    Comment: (NOTE) The eGFR has been calculated using the CKD EPI equation. This calculation has not been validated in all clinical situations. eGFR's persistently <60 mL/min signify possible Chronic Kidney Disease.    Anion gap 9 5 - 15  I-stat troponin, ED     Status: None   Collection Time: 07/03/16  9:04 AM  Result Value Ref Range   Troponin i, poc 0.00 0.00 - 0.08 ng/mL   Comment 3            Comment:  Due to the release kinetics of cTnI, a negative result within the first hours of the onset of symptoms does not rule out myocardial infarction with certainty. If myocardial infarction is still suspected, repeat the test at appropriate intervals.   I-stat Chem 8, ED     Status: Abnormal   Collection Time: 07/03/16  9:06 AM  Result Value Ref Range   Sodium 143 135 - 145 mmol/L   Potassium 3.9 3.5 - 5.1 mmol/L   Chloride 104 101 - 111 mmol/L   BUN 13 6 - 20 mg/dL   Creatinine, Ser 0.90 0.44 - 1.00 mg/dL   Glucose, Bld 116 (H) 65 - 99 mg/dL   Calcium, Ion 1.13 (L) 1.15 - 1.40 mmol/L   TCO2 27 0 - 100 mmol/L   Hemoglobin 13.9 12.0 - 15.0 g/dL   HCT 41.0 36.0 - 46.0 %  I-Stat Troponin, ED (not at [facility])     Status: None   Collection Time: 07/03/16  1:40 PM  Result Value Ref Range   Troponin i, poc 0.00 0.00 - 0.08 ng/mL   Comment 3            Comment: Due to the release kinetics of cTnI, a negative result within the first hours of the onset of symptoms does not rule out myocardial infarction with certainty. If myocardial infarction is still suspected, repeat the test at appropriate intervals.   Troponin I-serum (0, 3, 6 hours)     Status: None   Collection Time: 07/03/16  1:50 PM  Result Value Ref Range   Troponin I <0.03 <0.03 ng/mL   Dg Chest Portable 1 View  Result Date: 07/03/2016 CLINICAL DATA:  Chest pain EXAM: PORTABLE CHEST 1 VIEW COMPARISON:  None. FINDINGS: The heart size and mediastinal contours are within normal limits. Both lungs are clear. The visualized skeletal structures are unremarkable. IMPRESSION: No active disease. Electronically Signed   By: Franchot Gallo M.D.   On: 07/03/2016 09:20   Ct Angio Chest/abd/pel For Dissection W And/or Wo Contrast  Result Date: 07/03/2016 CLINICAL DATA:  80 year old female with stabbing central chest pain beginning earlier today at 0500 hours. EXAM: CT ANGIOGRAPHY CHEST, ABDOMEN AND PELVIS TECHNIQUE: Multidetector CT imaging  through the chest, abdomen and pelvis was performed using the standard protocol during bolus administration of intravenous contrast. Multiplanar reconstructed images and MIPs were obtained and reviewed to evaluate the vascular anatomy. CONTRAST:  100 mL Isovue 350 COMPARISON:  None. FINDINGS: CTA CHEST FINDINGS Cardiovascular: Initial non contrasted images demonstrate  no high attenuation material within the aortic wall to suggest the presence of an acute intramural hematoma. There is scant calcification along the course of the left anterior descending coronary artery. Conventional 3 vessel arch anatomy. Preferential opacification of the thoracic aorta. No evidence of thoracic aortic aneurysm or dissection. Borderline cardiomegaly with prominent right atrium. No pericardial effusion. The pulmonary arteries are also relatively well opacified. No evidence of pulmonary embolus to the segmental level. Mediastinum/Nodes: No enlarged mediastinal, hilar, or axillary lymph nodes. Thyroid gland, trachea, and esophagus demonstrate no significant findings. Lungs/Pleura: Minimal dependent atelectasis. Otherwise, the lungs are clear. No pleural effusion or pneumothorax. Musculoskeletal: No acute fracture or aggressive appearing lytic or blastic osseous lesion. Review of the MIP images confirms the above findings. CTA ABDOMEN AND PELVIS FINDINGS VASCULAR Aorta: Normal caliber aorta without aneurysm, dissection, vasculitis or significant stenosis. Celiac: Patent without evidence of aneurysm, dissection, vasculitis or significant stenosis. SMA: Patent without evidence of aneurysm, dissection, vasculitis or significant stenosis. Renals: Solitary right and codominant dual left-sided renal arteries. All 3 renal arteries are widely patent without evidence of significant atherosclerotic plaque or fibromuscular dysplasia. IMA: Patent without evidence of aneurysm, dissection, vasculitis or significant stenosis. Inflow: Patent without  evidence of aneurysm, dissection, vasculitis or significant stenosis. Veins: No obvious venous abnormality within the limitations of this arterial phase study. Review of the MIP images confirms the above findings. NON-VASCULAR Hepatobiliary: Irregularly-shaped lesion along the posterior margin of the right hepatic lobe extending from near the dome inferiorly along segment 6. The region is primarily hypodense with mosaic internal dystrophic calcifications. This likely represents a partially calcified hemangioma, or sequelae of old granulomatous disease, or some other benign process. No additional hepatic lesion is identified. Gallbladder is unremarkable. No intra or extrahepatic biliary ductal dilatation. Pancreas: Unremarkable. No pancreatic ductal dilatation or surrounding inflammatory changes. Spleen: Normal in size without focal abnormality. Adrenals/Urinary Tract: Adrenal glands are unremarkable. Kidneys are normal, without renal calculi, focal lesion, or hydronephrosis. Bladder is unremarkable. Stomach/Bowel: Extensive colonic diverticulosis without evidence of active inflammation to suggest acute diverticulitis. No focal bowel wall thickening or evidence obstruction. Normal appendix in the right lower quadrant. Unremarkable appearance of the stomach and duodenum. Lymphatic: No enlarged or suspicious adenopathy. Surgical clips are present in the retroperitoneum and along both pelvic sidewalls suggesting prior nodal dissection. Reproductive: Surgical changes of prior hysterectomy. No adnexal mass. Other: Small fat containing periumbilical hernia.  Diastases recti. Musculoskeletal: No acute fracture or aggressive appearing lytic or blastic osseous lesion. Mild multilevel degenerative disc disease. Prominent Schmorl's node in the superior endplate of L4. Review of the MIP images confirms the above findings. IMPRESSION: CTA CHEST 1. No evidence of dissection or other acute aortic abnormality. 2. Please note that  although the presence of coronary artery calcium documents the presence of coronary artery disease, the severity of this disease and any potential stenosis cannot be assessed on this non-gated CT examination. Assessment for potential risk factor modification, dietary therapy or pharmacologic therapy may be warranted, if clinically indicated. 3. No evidence of acute pulmonary embolus to the segmental level. 4. Borderline cardiomegaly with mildly enlarged right atrium. CTA ABD/PELVIS 1. No evidence of aortic dissection, aneurysm or other acute aortic abnormality. 2. No other acute abnormality in the abdomen or pelvis to explain the patient's central chest pain. 3. Colonic diverticular disease without CT evidence of active inflammation. 4. Incidental note is made of an irregularly shaped lesion with mosaic internal dystrophic calcifications along the posterior aspect of the liver. This almost  certainly represents either a partially calcified hemangioma, sequelae of old granulomatous disease, or the sequelae of some other benign process. 5. Surgical changes suggest prior retroperitoneal and bilateral pelvic sidewall lymph node dissection. 6. Small fat containing periumbilical hernia. 7. Diastases recti. Signed, Criselda Peaches, MD [facility] Electronically Signed   By: Jacqulynn Cadet M.D.   On: 07/03/2016 11:14    Assessment: Chest pain  Gastroesophageal reflux  Dysphagia  Plan:   cardiac workup  Empiric PPI Will follow with you and plan outpatient EGD/colonoscopy with possible esophageal dilatation at some point.  Siena Poehler C 07/03/2016, 3:16 PM  Pager [phone] If no answer or after 5 PM call [phone]

## 2016-07-03 NOTE — ED Notes (Signed)
Two unsuccessful attempts to give report to floor.

## 2016-07-03 NOTE — ED Provider Notes (Signed)
[facility]-EMERGENCY DEPT Provider Note   CSN: [id number] Arrival date & time: 07/03/16  [id number]     History   Chief Complaint Chief Complaint  Patient presents with  . Chest Pain    HPI Jade Bishop is a 80 y.o. female.  HPI Patient presents with chest pain. Began around 5 this morning. Went to right jaw and left shoulder. Some nausea. No relief nitroglycerin. Slight relief with morphine and Zofran. She is not had pains like this before. No relief with Alka-Seltzer or baking soda. No known coronary artery disease. She does not smoke. She has not had pains like this before. She's been doing well the last few days. Pain is sharp and worse with breathing. Patient appears uncomfortable. Starts in her mid chest and does go down to her abdomen.   History reviewed. No pertinent past medical history.  There are no active problems to display for this patient.   Past Surgical History:  Procedure Laterality Date  . ABDOMINAL HYSTERECTOMY  2002  . APPENDECTOMY      OB History    No data available       Home Medications    Prior to Admission medications   Medication Sig Start Date End Date Taking? Authorizing Provider  aspirin 325 MG tablet Take 325 mg by mouth every 6 (six) hours as needed for mild pain.   Yes Historical Provider, MD  aspirin-sod bicarb-citric acid (ALKA-SELTZER) 325 MG TBEF tablet Take 325 mg by mouth every 6 (six) hours as needed (cold symptoms).   Yes Historical Provider, MD  ibuprofen (ADVIL,MOTRIN) 200 MG tablet Take 200 mg by mouth every 6 (six) hours as needed for moderate pain.   Yes Historical Provider, MD  Polyethyl Glycol-Propyl Glycol (SYSTANE OP) Place 1 drop into the left eye daily.   Yes Historical Provider, MD  Vitamin D, Ergocalciferol, (DRISDOL) 50000 units CAPS capsule Take 1 capsule by mouth once a week. 06/17/16  Yes Historical Provider, MD    Family History No family history on file.  Social History Social History  Substance Use Topics  .  Smoking status: Never Smoker  . Smokeless tobacco: Never Used  . Alcohol use No     Allergies   Patient has no allergy information on record.   Review of Systems Review of Systems  Constitutional: Positive for appetite change.  HENT: Negative for congestion.   Respiratory: Negative for shortness of breath.   Cardiovascular: Positive for chest pain.  Gastrointestinal: Negative for abdominal pain.  Genitourinary: Negative for difficulty urinating.  Musculoskeletal: Negative for back pain.  Skin: Negative for color change.  Neurological: Negative for facial asymmetry and light-headedness.  Hematological: Negative for adenopathy.     Physical Exam Updated Vital Signs BP 124/59   Pulse 79   Temp 98.7 F (37.1 C)   Resp 19   Ht 5\' 2"  (1.575 m)   Wt 180 lb (81.6 kg)   SpO2 (!) 89%   BMI 32.92 kg/m   Physical Exam  Constitutional: She appears well-developed.  Patient appears uncomfortable  HENT:  Head: Atraumatic.  Eyes: EOM are normal.  Neck: No JVD present.  Cardiovascular: Normal rate.   Pulmonary/Chest: No respiratory distress.  Abdominal: There is no tenderness.  Musculoskeletal: She exhibits no edema.  Mild bilateral lower extremity pitting edema.  Neurological: She is alert.  Some chronic left-sided facial droop and difficulty closing left eye.  Skin: Skin is warm.  Psychiatric: She has a normal mood and affect.  ED Treatments / Results  Labs (all labs ordered are listed, but only abnormal results are displayed) Labs Reviewed  CBC - Abnormal; Notable for the following:       Result Value   WBC 12.1 (*)    All other components within normal limits  COMPREHENSIVE METABOLIC PANEL - Abnormal; Notable for the following:    Glucose, Bld 123 (*)    All other components within normal limits  I-STAT CHEM 8, ED - Abnormal; Notable for the following:    Glucose, Bld 116 (*)    Calcium, Ion 1.13 (*)    All other components within normal limits  I-STAT  TROPOININ, ED    EKG  EKG Interpretation  Date/Time:  Wednesday July 03 2016 08:40:52 EST Ventricular Rate:  66 PR Interval:    QRS Duration: 93 QT Interval:  411 QTC Calculation: 431 R Axis:   -16 Text Interpretation:  Sinus rhythm Borderline left axis deviation Low voltage, precordial leads Borderline T wave abnormalities Confirmed by Rubin Payor  MD, Harrold Donath [phone]) on 07/03/2016 8:48:55 AM Also confirmed by Rubin Payor  MD, Delmer Kowalski [phone]), editor [place], [place] [phone])  on 07/03/2016 9:15:06 AM       Radiology Dg Chest Portable 1 View  Result Date: 07/03/2016 CLINICAL DATA:  Chest pain EXAM: PORTABLE CHEST 1 VIEW COMPARISON:  None. FINDINGS: The heart size and mediastinal contours are within normal limits. Both lungs are clear. The visualized skeletal structures are unremarkable. IMPRESSION: No active disease. Electronically Signed   By: Marlan Palau M.D.   On: 07/03/2016 09:20   Ct Angio Chest/abd/pel For Dissection W And/or Wo Contrast  Result Date: 07/03/2016 CLINICAL DATA:  80 year old female with stabbing central chest pain beginning earlier today at 0500 hours. EXAM: CT ANGIOGRAPHY CHEST, ABDOMEN AND PELVIS TECHNIQUE: Multidetector CT imaging through the chest, abdomen and pelvis was performed using the standard protocol during bolus administration of intravenous contrast. Multiplanar reconstructed images and MIPs were obtained and reviewed to evaluate the vascular anatomy. CONTRAST:  100 mL Isovue 350 COMPARISON:  None. FINDINGS: CTA CHEST FINDINGS Cardiovascular: Initial non contrasted images demonstrate no high attenuation material within the aortic wall to suggest the presence of an acute intramural hematoma. There is scant calcification along the course of the left anterior descending coronary artery. Conventional 3 vessel arch anatomy. Preferential opacification of the thoracic aorta. No evidence of thoracic aortic aneurysm or dissection. Borderline cardiomegaly with  prominent right atrium. No pericardial effusion. The pulmonary arteries are also relatively well opacified. No evidence of pulmonary embolus to the segmental level. Mediastinum/Nodes: No enlarged mediastinal, hilar, or axillary lymph nodes. Thyroid gland, trachea, and esophagus demonstrate no significant findings. Lungs/Pleura: Minimal dependent atelectasis. Otherwise, the lungs are clear. No pleural effusion or pneumothorax. Musculoskeletal: No acute fracture or aggressive appearing lytic or blastic osseous lesion. Review of the MIP images confirms the above findings. CTA ABDOMEN AND PELVIS FINDINGS VASCULAR Aorta: Normal caliber aorta without aneurysm, dissection, vasculitis or significant stenosis. Celiac: Patent without evidence of aneurysm, dissection, vasculitis or significant stenosis. SMA: Patent without evidence of aneurysm, dissection, vasculitis or significant stenosis. Renals: Solitary right and codominant dual left-sided renal arteries. All 3 renal arteries are widely patent without evidence of significant atherosclerotic plaque or fibromuscular dysplasia. IMA: Patent without evidence of aneurysm, dissection, vasculitis or significant stenosis. Inflow: Patent without evidence of aneurysm, dissection, vasculitis or significant stenosis. Veins: No obvious venous abnormality within the limitations of this arterial phase study. Review of the MIP images confirms the above findings. NON-VASCULAR Hepatobiliary:  Irregularly-shaped lesion along the posterior margin of the right hepatic lobe extending from near the dome inferiorly along segment 6. The region is primarily hypodense with mosaic internal dystrophic calcifications. This likely represents a partially calcified hemangioma, or sequelae of old granulomatous disease, or some other benign process. No additional hepatic lesion is identified. Gallbladder is unremarkable. No intra or extrahepatic biliary ductal dilatation. Pancreas: Unremarkable. No  pancreatic ductal dilatation or surrounding inflammatory changes. Spleen: Normal in size without focal abnormality. Adrenals/Urinary Tract: Adrenal glands are unremarkable. Kidneys are normal, without renal calculi, focal lesion, or hydronephrosis. Bladder is unremarkable. Stomach/Bowel: Extensive colonic diverticulosis without evidence of active inflammation to suggest acute diverticulitis. No focal bowel wall thickening or evidence obstruction. Normal appendix in the right lower quadrant. Unremarkable appearance of the stomach and duodenum. Lymphatic: No enlarged or suspicious adenopathy. Surgical clips are present in the retroperitoneum and along both pelvic sidewalls suggesting prior nodal dissection. Reproductive: Surgical changes of prior hysterectomy. No adnexal mass. Other: Small fat containing periumbilical hernia.  Diastases recti. Musculoskeletal: No acute fracture or aggressive appearing lytic or blastic osseous lesion. Mild multilevel degenerative disc disease. Prominent Schmorl's node in the superior endplate of L4. Review of the MIP images confirms the above findings. IMPRESSION: CTA CHEST 1. No evidence of dissection or other acute aortic abnormality. 2. Please note that although the presence of coronary artery calcium documents the presence of coronary artery disease, the severity of this disease and any potential stenosis cannot be assessed on this non-gated CT examination. Assessment for potential risk factor modification, dietary therapy or pharmacologic therapy may be warranted, if clinically indicated. 3. No evidence of acute pulmonary embolus to the segmental level. 4. Borderline cardiomegaly with mildly enlarged right atrium. CTA ABD/PELVIS 1. No evidence of aortic dissection, aneurysm or other acute aortic abnormality. 2. No other acute abnormality in the abdomen or pelvis to explain the patient's central chest pain. 3. Colonic diverticular disease without CT evidence of active inflammation.  4. Incidental note is made of an irregularly shaped lesion with mosaic internal dystrophic calcifications along the posterior aspect of the liver. This almost certainly represents either a partially calcified hemangioma, sequelae of old granulomatous disease, or the sequelae of some other benign process. 5. Surgical changes suggest prior retroperitoneal and bilateral pelvic sidewall lymph node dissection. 6. Small fat containing periumbilical hernia. 7. Diastases recti. Signed, Sterling BigHeath K. McCullough, MD [facility] Electronically Signed   By: Malachy MoanHeath  McCullough M.D.   On: 07/03/2016 11:14    Procedures Procedures (including critical care time)  Medications Ordered in ED Medications  gi cocktail (Maalox,Lidocaine,Donnatal) (30 mLs Oral Given 07/03/16 0944)  iopamidol (ISOVUE-370) 76 % injection (100 mLs  Contrast Given 07/03/16 1046)  morphine 4 MG/ML injection 4 mg (4 mg Intravenous Given 07/03/16 1132)     Initial Impression / Assessment and Plan / ED Course  I have reviewed the triage vital signs and the nursing notes.  Pertinent labs & imaging results that were available during my care of the patient were reviewed by me and considered in my medical decision making (see chart for details).  Clinical Course     Patient with chest pain. Mid chest but also states it goes down into her abdomen. States worse with breathing. EKG reassuring. Enzymes negative. No relief with nitroglycerin. No relief with GI cocktail. CT angiography of aorta done due to pain going from chest abdomen but also somewhat to evaluate for PE. No proximal PEs no dissection. Continued pain.  EKG stable. Has had pain since 5:30 this morning. Admit to internal medicine.  Final Clinical Impressions(s) / ED Diagnoses   Final diagnoses:  Chest pain, unspecified type    New Prescriptions New Prescriptions   No medications on file     Benjiman Core, MD 07/03/16  1145
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
## 2016-07-03 NOTE — ED Triage Notes (Signed)
TO ED via [facility] from home -- with c/o chest pain- substernal -- radiated to neck and shoulder-- received NTG x 1 -- without relief-- received Morphine 4 mg IV and Zofran 4mg  IV by EMS-- without relief.  No nausea, pt had taken Alka -selzer and baking soda prior to calling EMS.

## 2016-07-04 ENCOUNTER — Observation Stay (HOSPITAL_COMMUNITY): Payer: Commercial Managed Care - HMO

## 2016-07-04 ENCOUNTER — Encounter (HOSPITAL_COMMUNITY): Payer: Commercial Managed Care - HMO

## 2016-07-04 DIAGNOSIS — R079 Chest pain, unspecified: Secondary | ICD-10-CM | POA: Diagnosis not present

## 2016-07-04 DIAGNOSIS — K219 Gastro-esophageal reflux disease without esophagitis: Secondary | ICD-10-CM

## 2016-07-04 DIAGNOSIS — Z79899 Other long term (current) drug therapy: Secondary | ICD-10-CM | POA: Diagnosis not present

## 2016-07-04 DIAGNOSIS — R600 Localized edema: Secondary | ICD-10-CM | POA: Diagnosis not present

## 2016-07-04 DIAGNOSIS — Z7982 Long term (current) use of aspirin: Secondary | ICD-10-CM | POA: Diagnosis not present

## 2016-07-04 DIAGNOSIS — R071 Chest pain on breathing: Secondary | ICD-10-CM | POA: Diagnosis not present

## 2016-07-04 LAB — BASIC METABOLIC PANEL
ANION GAP: 8 (ref 5–15)
BUN: 9 mg/dL (ref 6–20)
CHLORIDE: 103 mmol/L (ref 101–111)
CO2: 26 mmol/L (ref 22–32)
Calcium: 9 mg/dL (ref 8.9–10.3)
Creatinine, Ser: 0.85 mg/dL (ref 0.44–1.00)
GFR calc Af Amer: >60 mL/min (ref >60)
GFR calc non Af Amer: >60 mL/min (ref >60)
Glucose, Bld: 129 mg/dL — ABNORMAL HIGH (ref 65–99)
POTASSIUM: 3.8 mmol/L (ref 3.5–5.1)
Sodium: 137 mmol/L (ref 135–145)

## 2016-07-04 LAB — CBC
HEMATOCRIT: 37.8 % (ref 36.0–46.0)
Hemoglobin: 12.5 g/dL (ref 12.0–15.0)
MCH: 29.8 pg (ref 26.0–34.0)
MCHC: 33.1 g/dL (ref 30.0–36.0)
MCV: 90.2 fL (ref 78.0–100.0)
PLATELETS: 201 10*3/uL (ref 150–400)
RBC: 4.19 MIL/uL (ref 3.87–5.11)
RDW: 12.9 % (ref 11.5–15.5)
WBC: 11.4 10*3/uL — AB (ref 4.0–10.5)

## 2016-07-04 LAB — NM MYOCAR MULTI W/SPECT W/WALL MOTION / EF: CHL CUP RESTING HR STRESS: 70 {beats}/min

## 2016-07-04 MED ORDER — TECHNETIUM TC 99M TETROFOSMIN IV KIT
10.0000 | PACK | Freq: Once | INTRAVENOUS | Status: AC | PRN
  Administered 2016-07-04: 10 via INTRAVENOUS

## 2016-07-04 MED ORDER — TECHNETIUM TC 99M TETROFOSMIN IV KIT
30.0000 | PACK | Freq: Once | INTRAVENOUS | Status: AC | PRN
  Administered 2016-07-04: 30 via INTRAVENOUS

## 2016-07-04 MED ORDER — SODIUM IODIDE I 131 CAPSULE: 30.0000 | Freq: Once | INTRAVENOUS | Status: DC | PRN

## 2016-07-04 MED ORDER — REGADENOSON 0.4 MG/5ML IV SOLN
INTRAVENOUS | Status: AC
  Administered 2016-07-04: 0.4 mg via INTRAVENOUS
  Filled 2016-07-04: qty 5

## 2016-07-04 MED ORDER — REGADENOSON 0.4 MG/5ML IV SOLN
0.4000 mg | Freq: Once | INTRAVENOUS | Status: AC
Start: 2016-07-04 — End: 2016-07-04
  Administered 2016-07-04: 0.4 mg via INTRAVENOUS
  Filled 2016-07-04: qty 5

## 2016-07-04 MED ORDER — ACETAMINOPHEN 325 MG PO TABS: 650.0000 mg | ORAL_TABLET | Freq: Four times a day (QID) | ORAL | Status: DC | PRN

## 2016-07-04 MED ORDER — FAMOTIDINE 20 MG PO TABS
20.0000 mg | ORAL_TABLET | Freq: Two times a day (BID) | ORAL | Status: DC
  Administered 2016-07-04: 20 mg via ORAL
  Filled 2016-07-04: qty 1

## 2016-07-04 MED ORDER — FAMOTIDINE 20 MG PO TABS: 20.0000 mg | ORAL_TABLET | Freq: Two times a day (BID) | ORAL | 0 refills | Status: DC

## 2016-07-04 NOTE — Progress Notes (Signed)
Lexiscan Myoview completed. Final report pending radiologist reading.  Corine ShelterLUKE Beverley Allender PA-C 07/04/2016 9:20 AM

## 2016-07-04 NOTE — Discharge Instructions (Signed)
Chest Wall Pain °Chest wall pain is pain in or around the bones and muscles of your chest. Sometimes, an injury causes this pain. Sometimes, the cause may not be known. This pain may take several weeks or longer to get better. °HOME CARE °Pay attention to any changes in your symptoms. Take these actions to help with your pain: °· Rest as told by your doctor. °· Avoid activities that cause pain. Try not to use your chest, belly (abdominal), or side muscles to lift heavy things. °· If directed, apply ice to the painful area: °¨ Put ice in a plastic bag. °¨ Place a towel between your skin and the bag. °¨ Leave the ice on for 20 minutes, 2-3 times per day. °· Take over-the-counter and prescription medicines only as told by your doctor. °· Do not use tobacco products, including cigarettes, chewing tobacco, and e-cigarettes. If you need help quitting, ask your doctor. °· Keep all follow-up visits as told by your doctor. This is important. °GET HELP IF: °· You have a fever. °· Your chest pain gets worse. °· You have new symptoms. °GET HELP RIGHT AWAY IF: °· You feel sick to your stomach (nauseous) or you throw up (vomit). °· You feel sweaty or light-headed. °· You have a cough with phlegm (sputum) or you cough up blood. °· You are short of breath. °  °This information is not intended to replace advice given to you by your health care provider. Make sure you discuss any questions you have with your health care provider. °  °Document Released: 01/29/2008 Document Revised: 05/03/2015 Document Reviewed: 11/07/2014 °[employer]vier Interactive Patient Education ©2016 [employer] ° °

## 2016-07-04 NOTE — Discharge Summary (Addendum)
Physician Discharge Summary  Jade Bishop ZOX:[id number] DOB: 1936-01-02 DOA: 07/03/2016  PCP: Gaye AlkenBARNES,ELIZABETH STEWART, MD  Admit date: 07/03/2016 Discharge date: 07/04/2016   Recommendations for Outpatient Follow-Up:   1. Outpatient referral for EGD-- seen by Dr. Madilyn FiremanHayes in hospital but patient not sure he is contracted with insurance   Discharge Diagnosis:   Active Problems:   Chest pain   Dysphagia   GERD (gastroesophageal reflux disease)   Musculoskeletal pain   Hyperglycemia   Discharge disposition:  Home.    Discharge Condition: Improved.  Diet recommendation: Low sodium, heart healthy.   Wound care: None.   History of Present Illness:   Jade Bishop is a 80 y.o. female with medical history significant of vitamin D deficiency and GERD, presenting with chest pain. Constant. Started at approximately 05 100 on day of admission. Radiation to jaw and shoulder and back. No relief with nitroglycerin or aspirin. Mild relief with morphine. After GI cocktail in ED patient states there has become somewhat numb but no relief of chest pain. Worse with deep respirations. Denies any change with movement or palpation of chest wall. Worse with exertion. No relief with Alka-Seltzer at home. Patient states that she does have some increased belching but states that this pain is significantly worse and somewhat different than any reflux pain she's ever had before. Denies any recent cough, shortness of breath, palpitations, nausea, vomiting, dysuria, frequency, back pain, neck stiffness, headache, dizziness, LOC. Denies any change in large M.D. swelling. Stress test greater than 10 years ago normal   Hospital Course by Problem:   Chest pain -pleuritic in nature- no sign of PNA -symptoms improved with pepcid -CTA negative for PE/dissection-- no need for LE duplex (no new changes in LE) -CE negative -seen by GIMadilyn Fireman- Hayes: plan outpatient EGD/colonoscopy with possible esophageal  dilatation -added PPI- GERD information given   Medical Consultants:    GI   Discharge Exam:   Vitals:   07/04/16 0924 07/04/16 1350  BP:  (!) 108/51  Pulse: 92 71  Resp:    Temp:  98 F (36.7 C)   Vitals:   07/04/16 0921 07/04/16 0923 07/04/16 0924 07/04/16 1350  BP: (!) 105/51 (!) 114/51  (!) 108/51  Pulse: 96 89 92 71  Resp:      Temp:    98 F (36.7 C)  TempSrc:    Oral  SpO2:    91%  Weight:      Height:        Gen:  NAD   The results of significant diagnostics from this hospitalization (including imaging, microbiology, ancillary and laboratory) are listed below for reference.     Procedures and Diagnostic Studies:   Nm Myocar Multi W/spect W/wall Motion / Ef  Result Date: 07/04/2016 CLINICAL DATA:  Substernal chest pain radiating into left arm. EXAM: MYOCARDIAL IMAGING WITH SPECT (REST AND PHARMACOLOGIC-STRESS) GATED LEFT VENTRICULAR WALL MOTION STUDY LEFT VENTRICULAR EJECTION FRACTION TECHNIQUE: Standard myocardial SPECT imaging was performed after resting intravenous injection of 10 mCi Tc-565m tetrofosmin. Subsequently, intravenous infusion of Lexiscan was performed under the supervision of the Cardiology staff. At peak effect of the drug, 30 mCi Tc-25m tetrofosmin was injected intravenously and standard myocardial SPECT imaging was performed. Quantitative gated imaging was also performed to evaluate left ventricular wall motion, and estimate left ventricular ejection fraction. COMPARISON:  None. FINDINGS: Perfusion: There is no evidence of inducible myocardial ischemia. The basilar portion of the septum is attenuated on both resting and stress acquisitions. Wall  Motion: Normal left ventricular wall motion. No left ventricular dilation. Left Ventricular Ejection Fraction: 77 % End diastolic volume 65 ml End systolic volume 15 ml IMPRESSION: 1. No evidence of inducible myocardial ischemia. Attenuation of the basilar septum on both rest and stress acquisitions. 2.  Normal left ventricular wall motion. 3. Left ventricular ejection fraction 77% 4. Non invasive risk stratification*: Low *2012 Appropriate Use Criteria for Coronary Revascularization Focused Update: J Am Coll Cardiol. 2012;59(9):857-881. http://content.dementiazones.comonlinejacc.org/article.aspx?articleid=1201161 Electronically Signed   By: Irish LackGlenn  Yamagata M.D.   On: 07/04/2016 14:24   Dg Chest Portable 1 View  Result Date: 07/03/2016 CLINICAL DATA:  Chest pain EXAM: PORTABLE CHEST 1 VIEW COMPARISON:  None. FINDINGS: The heart size and mediastinal contours are within normal limits. Both lungs are clear. The visualized skeletal structures are unremarkable. IMPRESSION: No active disease. Electronically Signed   By: Marlan Palauharles  Clark M.D.   On: 07/03/2016 09:20   Ct Angio Chest/abd/pel For Dissection W And/or Wo Contrast  Result Date: 07/03/2016 CLINICAL DATA:  80 year old female with stabbing central chest pain beginning earlier today at 0500 hours. EXAM: CT ANGIOGRAPHY CHEST, ABDOMEN AND PELVIS TECHNIQUE: Multidetector CT imaging through the chest, abdomen and pelvis was performed using the standard protocol during bolus administration of intravenous contrast. Multiplanar reconstructed images and MIPs were obtained and reviewed to evaluate the vascular anatomy. CONTRAST:  100 mL Isovue 350 COMPARISON:  None. FINDINGS: CTA CHEST FINDINGS Cardiovascular: Initial non contrasted images demonstrate no high attenuation material within the aortic wall to suggest the presence of an acute intramural hematoma. There is scant calcification along the course of the left anterior descending coronary artery. Conventional 3 vessel arch anatomy. Preferential opacification of the thoracic aorta. No evidence of thoracic aortic aneurysm or dissection. Borderline cardiomegaly with prominent right atrium. No pericardial effusion. The pulmonary arteries are also relatively well opacified. No evidence of pulmonary embolus to the segmental level.  Mediastinum/Nodes: No enlarged mediastinal, hilar, or axillary lymph nodes. Thyroid gland, trachea, and esophagus demonstrate no significant findings. Lungs/Pleura: Minimal dependent atelectasis. Otherwise, the lungs are clear. No pleural effusion or pneumothorax. Musculoskeletal: No acute fracture or aggressive appearing lytic or blastic osseous lesion. Review of the MIP images confirms the above findings. CTA ABDOMEN AND PELVIS FINDINGS VASCULAR Aorta: Normal caliber aorta without aneurysm, dissection, vasculitis or significant stenosis. Celiac: Patent without evidence of aneurysm, dissection, vasculitis or significant stenosis. SMA: Patent without evidence of aneurysm, dissection, vasculitis or significant stenosis. Renals: Solitary right and codominant dual left-sided renal arteries. All 3 renal arteries are widely patent without evidence of significant atherosclerotic plaque or fibromuscular dysplasia. IMA: Patent without evidence of aneurysm, dissection, vasculitis or significant stenosis. Inflow: Patent without evidence of aneurysm, dissection, vasculitis or significant stenosis. Veins: No obvious venous abnormality within the limitations of this arterial phase study. Review of the MIP images confirms the above findings. NON-VASCULAR Hepatobiliary: Irregularly-shaped lesion along the posterior margin of the right hepatic lobe extending from near the dome inferiorly along segment 6. The region is primarily hypodense with mosaic internal dystrophic calcifications. This likely represents a partially calcified hemangioma, or sequelae of old granulomatous disease, or some other benign process. No additional hepatic lesion is identified. Gallbladder is unremarkable. No intra or extrahepatic biliary ductal dilatation. Pancreas: Unremarkable. No pancreatic ductal dilatation or surrounding inflammatory changes. Spleen: Normal in size without focal abnormality. Adrenals/Urinary Tract: Adrenal glands are unremarkable.  Kidneys are normal, without renal calculi, focal lesion, or hydronephrosis. Bladder is unremarkable. Stomach/Bowel: Extensive colonic diverticulosis without evidence of active inflammation to  suggest acute diverticulitis. No focal bowel wall thickening or evidence obstruction. Normal appendix in the right lower quadrant. Unremarkable appearance of the stomach and duodenum. Lymphatic: No enlarged or suspicious adenopathy. Surgical clips are present in the retroperitoneum and along both pelvic sidewalls suggesting prior nodal dissection. Reproductive: Surgical changes of prior hysterectomy. No adnexal mass. Other: Small fat containing periumbilical hernia.  Diastases recti. Musculoskeletal: No acute fracture or aggressive appearing lytic or blastic osseous lesion. Mild multilevel degenerative disc disease. Prominent Schmorl's node in the superior endplate of L4. Review of the MIP images confirms the above findings. IMPRESSION: CTA CHEST 1. No evidence of dissection or other acute aortic abnormality. 2. Please note that although the presence of coronary artery calcium documents the presence of coronary artery disease, the severity of this disease and any potential stenosis cannot be assessed on this non-gated CT examination. Assessment for potential risk factor modification, dietary therapy or pharmacologic therapy may be warranted, if clinically indicated. 3. No evidence of acute pulmonary embolus to the segmental level. 4. Borderline cardiomegaly with mildly enlarged right atrium. CTA ABD/PELVIS 1. No evidence of aortic dissection, aneurysm or other acute aortic abnormality. 2. No other acute abnormality in the abdomen or pelvis to explain the patient's central chest pain. 3. Colonic diverticular disease without CT evidence of active inflammation. 4. Incidental note is made of an irregularly shaped lesion with mosaic internal dystrophic calcifications along the posterior aspect of the liver. This almost certainly  represents either a partially calcified hemangioma, sequelae of old granulomatous disease, or the sequelae of some other benign process. 5. Surgical changes suggest prior retroperitoneal and bilateral pelvic sidewall lymph node dissection. 6. Small fat containing periumbilical hernia. 7. Diastases recti. Signed, Sterling Big, MD [facility] Radiology Electronically Signed   By: Malachy Moan M.D.   On: 07/03/2016 11:14     Labs:   Basic Metabolic Panel:  Recent Labs Lab 07/03/16 0845 07/03/16 0906 07/04/16 0351  NA 142 143 137  K 3.9 3.9 3.8  CL 107 104 103  CO2 26  --  26  GLUCOSE 123* 116* 129*  BUN 12 13 9   CREATININE 0.89 0.90 0.85  CALCIUM 9.4  --  9.0   GFR Estimated Creatinine Clearance: 54.5 mL/min (by C-G formula based on SCr of 0.85 mg/dL). Liver Function Tests:  Recent Labs Lab 07/03/16 0845  AST 30  ALT 27  ALKPHOS 62  BILITOT 0.8  PROT 6.5  ALBUMIN 3.8   No results for input(s): LIPASE, AMYLASE in the last 168 hours. No results for input(s): AMMONIA in the last 168 hours. Coagulation profile No results for input(s): INR, PROTIME in the last 168 hours.  CBC:  Recent Labs Lab 07/03/16 0845 07/03/16 0906 07/04/16 0351  WBC 12.1*  --  11.4*  HGB 14.0 13.9 12.5  HCT 41.2 41.0 37.8  MCV 89.2  --  90.2  PLT 212  --  201   Cardiac Enzymes:  Recent Labs Lab 07/03/16 1350 07/03/16 1650  TROPONINI <0.03 <0.03   BNP: Invalid input(s): POCBNP CBG: No results for input(s): GLUCAP in the last 168 hours. D-Dimer No results for input(s): DDIMER in the last 72 hours. Hgb A1c No results for input(s): HGBA1C in the last 72 hours. Lipid Profile No results for input(s): CHOL, HDL, LDLCALC, TRIG, CHOLHDL, LDLDIRECT in the last 72 hours. Thyroid function studies No results for input(s): TSH, T4TOTAL, T3FREE, THYROIDAB in the last 72 hours.  Invalid input(s): FREET3 Anemia work  up No results  for input(s): VITAMINB12, FOLATE, FERRITIN, TIBC, IRON, RETICCTPCT in the last 72 hours. Microbiology No results found for this or any previous visit (from the past 240 hour(s)).   Discharge Instructions:   Discharge Instructions    Diet - low sodium heart healthy    Complete by:  As directed    Increase activity slowly    Complete by:  As directed        Medication List    STOP taking these medications   aspirin 325 MG tablet   ibuprofen 200 MG tablet Commonly known as:  ADVIL,MOTRIN     TAKE these medications   acetaminophen 325 MG tablet Commonly known as:  TYLENOL Take 2 tablets (650 mg total) by mouth every 6 (six) hours as needed for mild pain (or Fever >/= 101).   aspirin-sod bicarb-citric acid 325 MG Tbef tablet Commonly known as:  ALKA-SELTZER Take 325 mg by mouth every 6 (six) hours as needed (cold symptoms).   famotidine 20 MG tablet Commonly known as:  PEPCID Take 1 tablet (20 mg total) by mouth 2 (two) times daily.   SYSTANE OP Place 1 drop into the left eye daily.   Vitamin D (Ergocalciferol) 50000 units Caps capsule Commonly known as:  DRISDOL Take 1 capsule by mouth once a week.      Follow-up Information    Gaye Alken, MD Follow up in 1 week(s).   Specialty:  Family Medicine Why:  outpatient referral to GI for EGD Contact information: [address] [place] [address] [phone]            Time coordinating discharge: 35 min  Signed:  Letitia Sabala Juanetta Gosling   [facility] 07/04/2016, 2:53 PM

## 2016-07-04 NOTE — Care Management Obs Status (Signed)
MEDICARE OBSERVATION STATUS NOTIFICATION   Patient Details  Name: Jade Bishop MRN: [id number] Date of Birth: 1936-03-25   Medicare Observation Status Notification Given:  Yes    Gala LewandowskyGraves-Bigelow, Patty Lopezgarcia Kaye, RN 07/04/2016, 12:29 PM

## 2016-11-12 ENCOUNTER — Other Ambulatory Visit: Payer: Self-pay | Admitting: Family Medicine

## 2016-11-12 DIAGNOSIS — Z1231 Encounter for screening mammogram for malignant neoplasm of breast: Secondary | ICD-10-CM

## 2016-11-29 ENCOUNTER — Ambulatory Visit
Admission: RE | Admit: 2016-11-29 | Discharge: 2016-11-29 | Disposition: A | Discharge status: Home / Self Care | Payer: Medicare Other | Source: Ambulatory Visit | Attending: Family Medicine | Admitting: Family Medicine

## 2016-11-29 DIAGNOSIS — Z1231 Encounter for screening mammogram for malignant neoplasm of breast: Secondary | ICD-10-CM

## 2017-10-29 ENCOUNTER — Other Ambulatory Visit: Payer: Self-pay | Admitting: Family Medicine

## 2017-10-29 DIAGNOSIS — Z139 Encounter for screening, unspecified: Secondary | ICD-10-CM

## 2017-12-01 ENCOUNTER — Ambulatory Visit
Admission: RE | Admit: 2017-12-01 | Discharge: 2017-12-01 | Disposition: A | Discharge status: Home / Self Care | Payer: Medicare Other | Source: Ambulatory Visit | Attending: Family Medicine | Admitting: Family Medicine

## 2017-12-01 DIAGNOSIS — Z139 Encounter for screening, unspecified: Secondary | ICD-10-CM

## 2018-07-22 ENCOUNTER — Other Ambulatory Visit: Payer: Self-pay | Admitting: Family Medicine

## 2018-07-22 DIAGNOSIS — M810 Age-related osteoporosis without current pathological fracture: Secondary | ICD-10-CM

## 2018-09-17 ENCOUNTER — Ambulatory Visit
Admission: RE | Admit: 2018-09-17 | Discharge: 2018-09-17 | Disposition: A | Discharge status: Home / Self Care | Payer: Medicare Other | Source: Ambulatory Visit | Attending: Family Medicine | Admitting: Family Medicine

## 2018-09-17 DIAGNOSIS — M810 Age-related osteoporosis without current pathological fracture: Secondary | ICD-10-CM

## 2018-12-13 ENCOUNTER — Emergency Department (HOSPITAL_COMMUNITY)
Admission: EM | Admit: 2018-12-13 | Discharge: 2018-12-13 | Disposition: A | Discharge status: Home / Self Care | Payer: Medicare Other | Attending: Emergency Medicine | Admitting: Emergency Medicine

## 2018-12-13 ENCOUNTER — Encounter (HOSPITAL_COMMUNITY): Payer: Self-pay

## 2018-12-13 DIAGNOSIS — M5432 Sciatica, left side: Secondary | ICD-10-CM

## 2018-12-13 DIAGNOSIS — Z85828 Personal history of other malignant neoplasm of skin: Secondary | ICD-10-CM | POA: Diagnosis not present

## 2018-12-13 DIAGNOSIS — M545 Low back pain: Secondary | ICD-10-CM | POA: Diagnosis present

## 2018-12-13 DIAGNOSIS — M4807 Spinal stenosis, lumbosacral region: Secondary | ICD-10-CM | POA: Diagnosis not present

## 2018-12-13 DIAGNOSIS — Z79899 Other long term (current) drug therapy: Secondary | ICD-10-CM | POA: Diagnosis not present

## 2018-12-13 MED ORDER — ONDANSETRON 4 MG PO TBDP
4.0000 mg | ORAL_TABLET | Freq: Once | ORAL | Status: AC
  Administered 2018-12-13: 4 mg via ORAL
  Filled 2018-12-13: qty 1

## 2018-12-13 MED ORDER — DIAZEPAM 5 MG PO TABS: 5.0000 mg | ORAL_TABLET | Freq: Two times a day (BID) | ORAL | 0 refills | Status: DC

## 2018-12-13 MED ORDER — MORPHINE SULFATE (PF) 4 MG/ML IV SOLN
4.0000 mg | Freq: Once | INTRAVENOUS | Status: AC
  Administered 2018-12-13: 4 mg via INTRAMUSCULAR
  Filled 2018-12-13: qty 1

## 2018-12-13 MED ORDER — HYDROCODONE-ACETAMINOPHEN 5-325 MG PO TABS: 1.0000 | ORAL_TABLET | ORAL | 0 refills | Status: DC | PRN

## 2018-12-13 MED ORDER — DIAZEPAM 5 MG PO TABS
5.0000 mg | ORAL_TABLET | Freq: Once | ORAL | Status: AC
  Administered 2018-12-13: 5 mg via ORAL
  Filled 2018-12-13: qty 1

## 2018-12-13 NOTE — ED Notes (Signed)
ED Provider at bedside. 

## 2018-12-13 NOTE — ED Provider Notes (Signed)
[facility] Provider Note   CSN: [id number] Arrival date & time: 12/13/18  1101    History   Chief Complaint Chief Complaint  Patient presents with  . Back Pain    HPI Jade Bishop is a 83 y.o. female.     Pt presents to the ED today with low back pain radiating down her left leg.  She has a hx of spinal stenosis and is followed by Dr. Danielle DessElsner.  The pt has been on prednisone, but it has not been helping.  The pt said she called her NS, but they told her to come here.  Pt said she could not sleep last night due to the pain.     Past Medical History:  Diagnosis Date  . Chest pain 07/03/2016  . GERD (gastroesophageal reflux disease)   . Sciatica, right side   . Spinal stenosis     Patient Active Problem List   Diagnosis Date Noted  . Chest pain 07/03/2016  . Dysphagia 07/03/2016  . GERD (gastroesophageal reflux disease) 07/03/2016  . Musculoskeletal pain 07/03/2016  . Hyperglycemia 07/03/2016    Past Surgical History:  Procedure Laterality Date  . ABDOMINAL HYSTERECTOMY  2002  . APPENDECTOMY    . SKIN CANCER EXCISION     located on abdominal wall     OB History   No obstetric history on file.      Home Medications    Prior to Admission medications   Medication Sig Start Date End Date Taking? Authorizing Provider  acetaminophen (TYLENOL) 325 MG tablet Take 2 tablets (650 mg total) by mouth every 6 (six) hours as needed for mild pain (or Fever >/= 101). 07/04/16   Joseph ArtVann, Jessica U, DO  aspirin-sod bicarb-citric acid (ALKA-SELTZER) 325 MG TBEF tablet Take 325 mg by mouth every 6 (six) hours as needed (cold symptoms).    [provider]  diazepam (VALIUM) 5 MG tablet Take 1 tablet (5 mg total) by mouth 2 (two) times daily. 12/13/18   Jacalyn LefevreHaviland, Shabre Kreher, MD  famotidine (PEPCID) 20 MG tablet Take 1 tablet (20 mg total) by mouth 2 (two) times daily. 07/04/16   Joseph ArtVann, Jessica U, DO  HYDROcodone-acetaminophen (NORCO/VICODIN)  5-325 MG tablet Take 1 tablet by mouth every 4 (four) hours as needed. 12/13/18   Jacalyn LefevreHaviland, Cherelle Midkiff, MD  Polyethyl Glycol-Propyl Glycol (SYSTANE OP) Place 1 drop into the left eye daily.    [provider]  Vitamin D, Ergocalciferol, (DRISDOL) 50000 units CAPS capsule Take 1 capsule by mouth once a week. 06/17/16   [provider]    Family History Family History  Problem Relation Age of Onset  . Stroke Mother 3135    Social History Social History   Tobacco Use  . Smoking status: Never Smoker  . Smokeless tobacco: Never Used  Substance Use Topics  . Alcohol use: No  . Drug use: No     Allergies   Codeine   Review of Systems Review of Systems  Musculoskeletal: Positive for back pain.     Physical Exam Updated Vital Signs BP 139/67   Pulse 62   Temp (!) 97.4 F (36.3 C) (Oral)   Resp 16   SpO2 97%   Physical Exam Vitals signs and nursing note reviewed.  Constitutional:      Appearance: Normal appearance.  HENT:     Head: Normocephalic and atraumatic.     Right Ear: External ear normal.     Left Ear: External ear normal.  Nose: Nose normal.     Mouth/Throat:     Mouth: Mucous membranes are moist.  Eyes:     Extraocular Movements: Extraocular movements intact.     Conjunctiva/sclera: Conjunctivae normal.     Pupils: Pupils are equal, round, and reactive to light.  Neck:     Musculoskeletal: Normal range of motion and neck supple.  Cardiovascular:     Rate and Rhythm: Normal rate and regular rhythm.     Pulses: Normal pulses.     Heart sounds: Normal heart sounds.  Pulmonary:     Effort: Pulmonary effort is normal.     Breath sounds: Normal breath sounds.  Abdominal:     General: Abdomen is flat.  Skin:    General: Skin is warm.     Capillary Refill: Capillary refill takes less than 2 seconds.  Neurological:     General: No focal deficit present.     Mental Status: She is alert and oriented to person, place, and time.     Comments:  + str leg raise on left  Psychiatric:        Mood and Affect: Mood normal.      ED Treatments / Results  Labs (all labs ordered are listed, but only abnormal results are displayed) Labs Reviewed - No data to display  EKG None  Radiology No results found.  Procedures Procedures (including critical care time)  Medications Ordered in ED Medications  morphine 4 MG/ML injection 4 mg (4 mg Intramuscular Given 12/13/18 1122)  ondansetron (ZOFRAN-ODT) disintegrating tablet 4 mg (4 mg Oral Given 12/13/18 1121)  diazepam (VALIUM) tablet 5 mg (5 mg Oral Given 12/13/18 1124)     Initial Impression / Assessment and Plan / ED Course  I have reviewed the triage vital signs and the nursing notes.  Pertinent labs & imaging results that were available during my care of the patient were reviewed by me and considered in my medical decision making (see chart for details).    Pain is much better after the above treatment.  She is ready to go home.  She is given a rx for valium and lortab.  Return if worse.  F/u with Dr. Danielle Dess.  Final Clinical Impressions(s) / ED Diagnoses   Final diagnoses:  Sciatica of left side  Spinal stenosis of lumbosacral region    ED Discharge Orders         Ordered    HYDROcodone-acetaminophen (NORCO/VICODIN) 5-325 MG tablet  Every 4 hours PRN     12/13/18 1152    diazepam (VALIUM) 5 MG tablet  2 times daily     12/13/18 1152           Jacalyn Lefevre, MD 12/13/18 1154

## 2018-12-13 NOTE — ED Notes (Signed)
Pt verbalized understanding of all d/c instructions, prescriptions, and f/u information. Opportunity for questioning and answers provided. VSS. All belongings with pt at this time. Pt wheeled to lobby

## 2018-12-13 NOTE — ED Triage Notes (Signed)
Pt arrives POV for back pain radiating down left leg. Pt reports pain has gotten worse, reached out to MD who was closed, so came here. Pt reports pain 10/10, pt is alert, oriented, and ambulatory.

## 2018-12-16 ENCOUNTER — Encounter (HOSPITAL_COMMUNITY): Payer: Self-pay | Admitting: Emergency Medicine

## 2018-12-16 ENCOUNTER — Other Ambulatory Visit: Payer: Self-pay

## 2018-12-16 ENCOUNTER — Emergency Department (HOSPITAL_COMMUNITY)
Admission: EM | Admit: 2018-12-16 | Discharge: 2018-12-16 | Disposition: A | Discharge status: Home / Self Care | Payer: Medicare Other | Attending: Emergency Medicine | Admitting: Emergency Medicine

## 2018-12-16 DIAGNOSIS — M5442 Lumbago with sciatica, left side: Secondary | ICD-10-CM | POA: Insufficient documentation

## 2018-12-16 DIAGNOSIS — G8929 Other chronic pain: Secondary | ICD-10-CM | POA: Insufficient documentation

## 2018-12-16 DIAGNOSIS — Z79899 Other long term (current) drug therapy: Secondary | ICD-10-CM | POA: Insufficient documentation

## 2018-12-16 DIAGNOSIS — M545 Low back pain: Secondary | ICD-10-CM | POA: Diagnosis present

## 2018-12-16 MED ORDER — KETOROLAC TROMETHAMINE 60 MG/2ML IM SOLN
60.0000 mg | Freq: Once | INTRAMUSCULAR | Status: AC
  Administered 2018-12-16: 60 mg via INTRAMUSCULAR
  Filled 2018-12-16: qty 2

## 2018-12-16 MED ORDER — GABAPENTIN 300 MG PO CAPS: 300.0000 mg | ORAL_CAPSULE | Freq: Two times a day (BID) | ORAL | 0 refills | Status: AC

## 2018-12-16 MED ORDER — LIDOCAINE 5 % EX PTCH: 1.0000 | MEDICATED_PATCH | CUTANEOUS | 0 refills | Status: AC

## 2018-12-16 MED ORDER — LIDOCAINE 5 % EX PTCH
1.0000 | MEDICATED_PATCH | CUTANEOUS | Status: DC
  Administered 2018-12-16: 18:00:00 1 via TRANSDERMAL
  Filled 2018-12-16: qty 1

## 2018-12-16 NOTE — ED Provider Notes (Addendum)
[facility] EMERGENCY DEPARTMENT Provider Note   CSN: [id number] Arrival date & time: 12/16/18  1737    History   Chief Complaint Chief Complaint  Patient presents with   Back Pain    HPI Jade Bishop is a 83 y.o. female.     The history is provided by the patient.  Back Pain  Location:  Lumbar spine Quality:  Shooting Radiates to:  L foot Pain severity:  Moderate Onset quality:  Gradual Timing:  Intermittent Progression:  Waxing and waning Chronicity:  Recurrent Context comment:  Hx of spinal stenosis, MRI confirmed left lower spine spinal stenosis Relieved by:  Narcotics Worsened by:  Movement Associated symptoms: tingling   Associated symptoms: no abdominal pain, no bladder incontinence, no bowel incontinence, no chest pain, no dysuria, no fever, no headaches, no leg pain, no numbness, no perianal numbness and no weakness     Past Medical History:  Diagnosis Date   Chest pain 07/03/2016   GERD (gastroesophageal reflux disease)    Sciatica, right side    Spinal stenosis     Patient Active Problem List   Diagnosis Date Noted   Chest pain 07/03/2016   Dysphagia 07/03/2016   GERD (gastroesophageal reflux disease) 07/03/2016   Musculoskeletal pain 07/03/2016   Hyperglycemia 07/03/2016    Past Surgical History:  Procedure Laterality Date   ABDOMINAL HYSTERECTOMY  2002   APPENDECTOMY     SKIN CANCER EXCISION     located on abdominal wall     OB History   No obstetric history on file.      Home Medications    Prior to Admission medications   Medication Sig Start Date End Date Taking? Authorizing Provider  acetaminophen (TYLENOL) 325 MG tablet Take 2 tablets (650 mg total) by mouth every 6 (six) hours as needed for mild pain (or Fever >/= 101). 07/04/16   Joseph Art, DO  aspirin-sod bicarb-citric acid (ALKA-SELTZER) 325 MG TBEF tablet Take 325 mg by mouth every 6 (six) hours as needed (cold symptoms).    [provider]  diazepam (VALIUM) 5 MG tablet Take 1 tablet (5 mg total) by mouth 2 (two) times daily. 12/13/18   Jacalyn Lefevre, MD  famotidine (PEPCID) 20 MG tablet Take 1 tablet (20 mg total) by mouth 2 (two) times daily. 07/04/16   Joseph Art, DO  gabapentin (NEURONTIN) 300 MG capsule Take 1 capsule (300 mg total) by mouth 2 (two) times daily for 10 days. 12/16/18 12/26/18  Jantz Main, DO  HYDROcodone-acetaminophen (NORCO/VICODIN) 5-325 MG tablet Take 1 tablet by mouth every 4 (four) hours as needed. 12/13/18   Jacalyn Lefevre, MD  lidocaine (LIDODERM) 5 % Place 1 patch onto the skin daily for 15 days. Remove & Discard patch within 12 hours or as directed by MD 12/16/18 12/31/18  Virgina Norfolk, DO  Polyethyl Glycol-Propyl Glycol (SYSTANE OP) Place 1 drop into the left eye daily.    [provider]  Vitamin D, Ergocalciferol, (DRISDOL) 50000 units CAPS capsule Take 1 capsule by mouth once a week. 06/17/16   [provider]    Family History Family History  Problem Relation Age of Onset   Stroke Mother 44    Social History Social History   Tobacco Use   Smoking status: Never Smoker   Smokeless tobacco: Never Used  Substance Use Topics   Alcohol use: No   Drug use: No     Allergies   Codeine   Review of  Systems Review of Systems  Constitutional: Negative for chills and fever.  HENT: Negative for ear pain and sore throat.   Eyes: Negative for pain and visual disturbance.  Respiratory: Negative for cough and shortness of breath.   Cardiovascular: Negative for chest pain and palpitations.  Gastrointestinal: Negative for abdominal pain, bowel incontinence and vomiting.  Genitourinary: Negative for bladder incontinence, dysuria and hematuria.  Musculoskeletal: Positive for back pain and gait problem. Negative for arthralgias.  Skin: Negative for color change and rash.  Neurological: Positive for tingling. Negative for dizziness, tremors, seizures,  syncope, facial asymmetry, speech difficulty, weakness, light-headedness, numbness and headaches.  All other systems reviewed and are negative.    Physical Exam Updated Vital Signs  ED Triage Vitals  Enc Vitals Group     BP 12/16/18 1742 (!) 196/74     Pulse Rate 12/16/18 1742 69     Resp 12/16/18 1742 18     Temp 12/16/18 1742 97.7 F (36.5 C)     Temp Source 12/16/18 1742 Oral     SpO2 12/16/18 1742 98 %     Weight 12/16/18 1745 185 lb (83.9 kg)     Height 12/16/18 1745 5\' 2"  (1.575 m)     Head Circumference --      Peak Flow --      Pain Score 12/16/18 1744 10     Pain Loc --      Pain Edu? --      Excl. in GC? --     Physical Exam Vitals signs and nursing note reviewed.  Constitutional:      General: She is not in acute distress.    Appearance: She is well-developed.  HENT:     Head: Normocephalic and atraumatic.  Eyes:     Extraocular Movements: Extraocular movements intact.     Conjunctiva/sclera: Conjunctivae normal.     Pupils: Pupils are equal, round, and reactive to light.  Neck:     Musculoskeletal: Normal range of motion and neck supple.  Cardiovascular:     Rate and Rhythm: Normal rate and regular rhythm.     Pulses: Normal pulses.     Heart sounds: Normal heart sounds. No murmur.  Pulmonary:     Effort: Pulmonary effort is normal. No respiratory distress.     Breath sounds: Normal breath sounds.  Abdominal:     Palpations: Abdomen is soft.     Tenderness: There is no abdominal tenderness.  Musculoskeletal: Normal range of motion.        General: No swelling or tenderness (no midline spinal tenderness).     Comments: + straight leg test on left  Skin:    General: Skin is warm and dry.     Capillary Refill: Capillary refill takes less than 2 seconds.  Neurological:     General: No focal deficit present.     Mental Status: She is alert and oriented to person, place, and time.     Cranial Nerves: No cranial nerve deficit.     Sensory: No sensory  deficit.     Motor: No weakness.     Coordination: Coordination normal.     Comments: 5+/5 strength, normal sensation, antalgic gait.       ED Treatments / Results  Labs (all labs ordered are listed, but only abnormal results are displayed) Labs Reviewed - No data to display  EKG None  Radiology No results found.  Procedures Procedures (including critical care time)  Medications Ordered in ED Medications  lidocaine (LIDODERM) 5 % 1 patch (1 patch Transdermal Patch Applied 12/16/18 1815)  ketorolac (TORADOL) injection 60 mg (60 mg Intramuscular Given 12/16/18 1815)     Initial Impression / Assessment and Plan / ED Course  I have reviewed the triage vital signs and the nursing notes.  Pertinent labs & imaging results that were available during my care of the patient were reviewed by me and considered in my medical decision making (see chart for details).     Jade Bishop is an 83 year old female with history of chest pain, reflux who presents to the ED with left-sided sciatic pain.  Patient had an MRI today that confirmed spinal stenosis on the left side of the lumbar spine.  Has had intermittent sciatic nerve pain over the last several weeks.  Patient just finished a Solu-Medrol Dosepak.  She is on Valium and narcotics.  Continues to have intermittent pain.  Patient is followed with neurosurgery.  They have recommended follow-up for pain injections.  She may need surgery at this time.  Patient has no symptoms of cauda equina.  MRI appears to not have shown this as as today she did discuss her MRI results today with her neurosurgeon.  Patient overall likely with breakthrough pain.  She obviously has radicular pain.  However, she appears to be neurologically intact.  Normal sensation.  Normal strength.  Has antalgic gait but is able to ambulate.  Patient was given a Toradol shot lidocaine patch with vast improvement of her pain.  She has no history of diabetes or poor kidney  function.  Recommend that she does take some ibuprofen for the next few days to help in addition to her home medications already.  Was given a prescription for lidocaine patch and gabapentin as well for further symptomatic relief.  Given return precautions.  Recommend that she continue to follow-up with neurosurgery.  Discharged in good condition.  This chart was dictated using voice recognition software.  Despite best efforts to proofread,  errors can occur which can change the documentation meaning.    Final Clinical Impressions(s) / ED Diagnoses   Final diagnoses:  Chronic left-sided low back pain with left-sided sciatica    ED Discharge Orders         Ordered    lidocaine (LIDODERM) 5 %  Every 24 hours     12/16/18 1837    gabapentin (NEURONTIN) 300 MG capsule  2 times daily     12/16/18 1837           Virgina Norfolk, DO 12/16/18 1851    Virgina Norfolk, DO 12/16/18 [phone]

## 2018-12-16 NOTE — Discharge Instructions (Addendum)
Follow-up with neurosurgery for pain injection

## 2018-12-16 NOTE — ED Triage Notes (Addendum)
Pt here for pinched nerve in back since Easter. Pt is taking hydrocodone but is not helping. Pt had MRI today. Pt cant tolerate pain.

## 2018-12-16 NOTE — ED Notes (Signed)
Patient verbalizes understanding of discharge instructions. Opportunity for questioning and answers were provided. Armband removed by staff, pt discharged from ED via wheelchair to home.  

## 2019-02-01 ENCOUNTER — Other Ambulatory Visit: Payer: Self-pay | Admitting: Family Medicine

## 2019-02-01 DIAGNOSIS — Z1231 Encounter for screening mammogram for malignant neoplasm of breast: Secondary | ICD-10-CM

## 2019-03-19 ENCOUNTER — Other Ambulatory Visit: Payer: Self-pay

## 2019-03-19 ENCOUNTER — Ambulatory Visit
Admission: RE | Admit: 2019-03-19 | Discharge: 2019-03-19 | Disposition: A | Discharge status: Home / Self Care | Payer: Medicare Other | Source: Ambulatory Visit | Attending: Family Medicine | Admitting: Family Medicine

## 2019-03-19 DIAGNOSIS — Z1231 Encounter for screening mammogram for malignant neoplasm of breast: Secondary | ICD-10-CM

## 2019-04-08 ENCOUNTER — Emergency Department (HOSPITAL_COMMUNITY)
Admission: EM | Admit: 2019-04-08 | Discharge: 2019-04-09 | Disposition: A | Discharge status: Home / Self Care | Payer: Medicare Other | Attending: Emergency Medicine | Admitting: Emergency Medicine

## 2019-04-08 ENCOUNTER — Encounter (HOSPITAL_COMMUNITY): Payer: Self-pay | Admitting: Emergency Medicine

## 2019-04-08 ENCOUNTER — Other Ambulatory Visit: Payer: Self-pay

## 2019-04-08 DIAGNOSIS — Z79899 Other long term (current) drug therapy: Secondary | ICD-10-CM | POA: Diagnosis not present

## 2019-04-08 DIAGNOSIS — R509 Fever, unspecified: Secondary | ICD-10-CM | POA: Diagnosis not present

## 2019-04-08 DIAGNOSIS — R1032 Left lower quadrant pain: Secondary | ICD-10-CM | POA: Diagnosis present

## 2019-04-08 DIAGNOSIS — K5732 Diverticulitis of large intestine without perforation or abscess without bleeding: Secondary | ICD-10-CM | POA: Diagnosis not present

## 2019-04-08 LAB — URINALYSIS, ROUTINE W REFLEX MICROSCOPIC
Bacteria, UA: NONE SEEN
Bilirubin Urine: NEGATIVE
Glucose, UA: NEGATIVE mg/dL
Ketones, ur: NEGATIVE mg/dL
Nitrite: NEGATIVE
Protein, ur: NEGATIVE mg/dL
Specific Gravity, Urine: 1.018 (ref 1.005–1.030)
pH: 6 (ref 5.0–8.0)

## 2019-04-08 LAB — CBC
HCT: 40.3 % (ref 36.0–46.0)
Hemoglobin: 13.5 g/dL (ref 12.0–15.0)
MCH: 30.5 pg (ref 26.0–34.0)
MCHC: 33.5 g/dL (ref 30.0–36.0)
MCV: 91.2 fL (ref 80.0–100.0)
Platelets: 240 10*3/uL (ref 150–400)
RBC: 4.42 MIL/uL (ref 3.87–5.11)
RDW: 12.2 % (ref 11.5–15.5)
WBC: 11.3 10*3/uL — ABNORMAL HIGH (ref 4.0–10.5)
nRBC: 0 % (ref 0.0–0.2)

## 2019-04-08 MED ORDER — SODIUM CHLORIDE 0.9% FLUSH: 3.0000 mL | Freq: Once | INTRAVENOUS | Status: DC

## 2019-04-08 NOTE — ED Triage Notes (Signed)
Patient here with fever and lower abdominal pain.  She states that she just finished a course of amoxicillin.  Patient states that she has been taking the med for a intestinal infection.  She states that she still has mucous in her stool.  No nausea or vomiting.

## 2019-04-09 ENCOUNTER — Emergency Department (HOSPITAL_COMMUNITY): Payer: Medicare Other

## 2019-04-09 LAB — COMPREHENSIVE METABOLIC PANEL
ALT: 35 U/L (ref 0–44)
AST: 47 U/L — ABNORMAL HIGH (ref 15–41)
Albumin: 3.5 g/dL (ref 3.5–5.0)
Alkaline Phosphatase: 70 U/L (ref 38–126)
Anion gap: 9 (ref 5–15)
BUN: 12 mg/dL (ref 8–23)
CO2: 24 mmol/L (ref 22–32)
Calcium: 9.4 mg/dL (ref 8.9–10.3)
Chloride: 105 mmol/L (ref 98–111)
Creatinine, Ser: 0.81 mg/dL (ref 0.44–1.00)
GFR calc Af Amer: >60 mL/min (ref >60)
GFR calc non Af Amer: >60 mL/min (ref >60)
Glucose, Bld: 143 mg/dL — ABNORMAL HIGH (ref 70–99)
Potassium: 3.8 mmol/L (ref 3.5–5.1)
Sodium: 138 mmol/L (ref 135–145)
Total Bilirubin: 0.9 mg/dL (ref 0.3–1.2)
Total Protein: 6.4 g/dL — ABNORMAL LOW (ref 6.5–8.1)

## 2019-04-09 LAB — LIPASE, BLOOD: Lipase: 26 U/L (ref 11–51)

## 2019-04-09 LAB — LACTIC ACID, PLASMA: Lactic Acid, Venous: 1.2 mmol/L (ref 0.5–1.9)

## 2019-04-09 MED ORDER — CIPROFLOXACIN HCL 500 MG PO TABS
500.0000 mg | ORAL_TABLET | Freq: Once | ORAL | Status: AC
  Administered 2019-04-09: 500 mg via ORAL
  Filled 2019-04-09: qty 1

## 2019-04-09 MED ORDER — CIPROFLOXACIN HCL 500 MG PO TABS: 500.0000 mg | ORAL_TABLET | Freq: Two times a day (BID) | ORAL | 0 refills | Status: DC

## 2019-04-09 MED ORDER — IOHEXOL 300 MG/ML  SOLN
100.0000 mL | Freq: Once | INTRAMUSCULAR | Status: AC | PRN
  Administered 2019-04-09: 05:00:00 100 mL via INTRAVENOUS

## 2019-04-09 MED ORDER — METRONIDAZOLE 500 MG PO TABS: 500.0000 mg | ORAL_TABLET | Freq: Two times a day (BID) | ORAL | 0 refills | Status: DC

## 2019-04-09 MED ORDER — SODIUM CHLORIDE 0.9 % IV BOLUS (SEPSIS)
1000.0000 mL | Freq: Once | INTRAVENOUS | Status: AC
  Administered 2019-04-09: 1000 mL via INTRAVENOUS

## 2019-04-09 MED ORDER — METRONIDAZOLE 500 MG PO TABS
500.0000 mg | ORAL_TABLET | Freq: Once | ORAL | Status: AC
  Administered 2019-04-09: 500 mg via ORAL
  Filled 2019-04-09: qty 1

## 2019-04-09 NOTE — ED Provider Notes (Signed)
[facility] EMERGENCY DEPARTMENT Provider Note   CSN: [id number] Arrival date & time: 04/08/19  2303     History   Chief Complaint Chief Complaint  Patient presents with  . Abdominal Pain    HPI Jade Bishop is a 83 y.o. female.     The history is provided by the patient and the spouse.  Abdominal Pain Pain location:  LLQ Pain radiates to:  Does not radiate Pain severity:  Moderate Onset quality:  Gradual Duration:  10 days Timing:  Intermittent Progression:  Worsening Chronicity:  New Relieved by:  Nothing Worsened by:  Bowel movements Associated symptoms: fever   Associated symptoms: no chest pain, no cough, no hematochezia and no vomiting   Patient presents with abdominal pain for the past 10 days.  She was seen by her PCP as an outpatient was started on amoxicillin for presumed diverticulitis She had some improvement, but is now having worsening left lower quadrant pain.  She also reports temperature up to 100 at home.  No vomiting.  No diarrhea, she reports constipation.  No bloody stool.  She does report mucus in her stool  Past Medical History:  Diagnosis Date  . Chest pain 07/03/2016  . GERD (gastroesophageal reflux disease)   . Sciatica, right side   . Spinal stenosis     Patient Active Problem List   Diagnosis Date Noted  . Chest pain 07/03/2016  . Dysphagia 07/03/2016  . GERD (gastroesophageal reflux disease) 07/03/2016  . Musculoskeletal pain 07/03/2016  . Hyperglycemia 07/03/2016    Past Surgical History:  Procedure Laterality Date  . ABDOMINAL HYSTERECTOMY  2002  . APPENDECTOMY    . SKIN CANCER EXCISION     located on abdominal wall     OB History   No obstetric history on file.      Home Medications    Prior to Admission medications   Medication Sig Start Date End Date Taking? Authorizing Provider  acetaminophen (TYLENOL) 325 MG tablet Take 2 tablets (650 mg total) by mouth every 6 (six) hours as needed for  mild pain (or Fever >/= 101). 07/04/16   Geradine Girt, DO  aspirin-sod bicarb-citric acid (ALKA-SELTZER) 325 MG TBEF tablet Take 325 mg by mouth every 6 (six) hours as needed (cold symptoms).    [provider]  diazepam (VALIUM) 5 MG tablet Take 1 tablet (5 mg total) by mouth 2 (two) times daily. 12/13/18   Isla Pence, MD  famotidine (PEPCID) 20 MG tablet Take 1 tablet (20 mg total) by mouth 2 (two) times daily. 07/04/16   Geradine Girt, DO  gabapentin (NEURONTIN) 300 MG capsule Take 1 capsule (300 mg total) by mouth 2 (two) times daily for 10 days. 12/16/18 12/26/18  Curatolo, Adam, DO  HYDROcodone-acetaminophen (NORCO/VICODIN) 5-325 MG tablet Take 1 tablet by mouth every 4 (four) hours as needed. 12/13/18   Isla Pence, MD  Polyethyl Glycol-Propyl Glycol (SYSTANE OP) Place 1 drop into the left eye daily.    [provider]  Vitamin D, Ergocalciferol, (DRISDOL) 50000 units CAPS capsule Take 1 capsule by mouth once a week. 06/17/16   [provider]    Family History Family History  Problem Relation Age of Onset  . Stroke Mother 40    Social History Social History   Tobacco Use  . Smoking status: Never Smoker  . Smokeless tobacco: Never Used  Substance Use Topics  . Alcohol use: No  . Drug use: No  Allergies   Codeine   Review of Systems Review of Systems  Constitutional: Positive for fever.  Respiratory: Negative for cough.   Cardiovascular: Negative for chest pain.  Gastrointestinal: Positive for abdominal pain. Negative for blood in stool, hematochezia and vomiting.  All other systems reviewed and are negative.    Physical Exam Updated Vital Signs BP 92/68   Pulse 80   Temp 99.4 F (37.4 C) (Oral)   Resp 16   SpO2 96%   Physical Exam CONSTITUTIONAL: Elderly, no acute distress HEAD: Normocephalic/atraumatic EYES: EOMI ENMT: Mask in place NECK: supple no meningeal signs SPINE/BACK:entire spine nontender CV: S1/S2 noted,  no murmurs/rubs/gallops noted LUNGS: Lungs are clear to auscultation bilaterally, no apparent distress ABDOMEN: soft, mild LLQ tenderness., no rebound or guarding, bowel sounds noted throughout abdomen, previous surgical scars noted GU:no cva tenderness NEURO: Pt is awake/alert/appropriate, moves all extremitiesx4.  No facial droop.   EXTREMITIES: pulses normal/equal, full ROM SKIN: warm, color normal PSYCH: no abnormalities of mood noted, alert and oriented to situation   ED Treatments / Results  Labs (all labs ordered are listed, but only abnormal results are displayed) Labs Reviewed  COMPREHENSIVE METABOLIC PANEL - Abnormal; Notable for the following components:      Result Value   Glucose, Bld 143 (*)    Total Protein 6.4 (*)    AST 47 (*)    All other components within normal limits  CBC - Abnormal; Notable for the following components:   WBC 11.3 (*)    All other components within normal limits  URINALYSIS, ROUTINE W REFLEX MICROSCOPIC - Abnormal; Notable for the following components:   Hgb urine dipstick SMALL (*)    Leukocytes,Ua TRACE (*)    All other components within normal limits  LIPASE, BLOOD  LACTIC ACID, PLASMA    EKG None  Radiology Ct Abdomen Pelvis W Contrast  Result Date: 04/09/2019 CLINICAL DATA:  Acute generalized abdominal pain EXAM: CT ABDOMEN AND PELVIS WITH CONTRAST TECHNIQUE: Multidetector CT imaging of the abdomen and pelvis was performed using the standard protocol following bolus administration of intravenous contrast. CONTRAST:  100mL OMNIPAQUE IOHEXOL 300 MG/ML  SOLN COMPARISON:  07/03/2016 FINDINGS: Lower chest:  Coronary calcification. Hepatobiliary: Stable masslike dystrophic calcification in the posterior right liver, favor hyalinized hemangioma.No evidence of biliary obstruction or stone. Pancreas: Unremarkable. Spleen: Unremarkable. Adrenals/Urinary Tract: Negative adrenals. No hydronephrosis or stone. Unremarkable bladder. Stomach/Bowel:  Numerous distal colonic diverticula with a thickened and inflamed diverticulum along the sigmoid colon. No fluid collection or pneumoperitoneum. No appendicitis. Vascular/Lymphatic: No acute vascular abnormality. No mass or adenopathy. Pelvic and lower abdominal retroperitoneal lymphadenectomy. Reproductive:No pathologic findings. Hysterectomy and presumed oophorectomies. Other: No ascites or pneumoperitoneum. Musculoskeletal: No acute abnormalities. Advanced disc and facet degeneration with L4-5 anterolisthesis and L5-S1 retrolisthesis. Osteopenia IMPRESSION: Sigmoid diverticulitis without abscess or pneumoperitoneum. Colonic diverticulosis is extensive. Electronically Signed   By: Marnee SpringJonathon  Watts M.D.   On: 04/09/2019 05:06    Procedures Procedures   Medications Ordered in ED Medications  sodium chloride flush (NS) 0.9 % injection 3 mL (has no administration in time range)  sodium chloride 0.9 % bolus 1,000 mL (0 mLs Intravenous Stopped 04/09/19 0542)  iohexol (OMNIPAQUE) 300 MG/ML solution 100 mL (100 mLs Intravenous Contrast Given 04/09/19 0438)  ciprofloxacin (CIPRO) tablet 500 mg (500 mg Oral Given 04/09/19 0538)  metroNIDAZOLE (FLAGYL) tablet 500 mg (500 mg Oral Given 04/09/19 0538)     Initial Impression / Assessment and Plan / ED Course  I have reviewed  the triage vital signs and the nursing notes.  Pertinent labs  results that were available during my care of the patient were reviewed by me and considered in my medical decision making (see chart for details).        4:21 AM Patient reports abdominal pain for over 10 days and has been on amoxicillin.  She is now having fever as well as worsened pain.  She did have a drop in her blood pressure, will check lactate.  She has not had recent CT imaging, will proceed with CT scan 6:21 AM Patient improved.  Lactate negative.  Patient ambulated without difficulty BP (!) 145/63 (BP Location: Right Arm)   Pulse 85   Temp 99.4 F (37.4 C)  (Oral)   Resp 16   SpO2 97%  CT scan confirms diverticulitis without abscess, without perforation Patient overall well-appearing.  She has already failed outpatient amoxicillin.  Therefore will avoid Augmentin.  Will need to start with Cipro and Flagyl due to failure of amoxicillin. Discussed need to avoid alcohol, discussed need to stay hydrated. She will follow-up with PCP in the next week if her pain continues as she may need some alteration in her medicines. If she has any new vomiting or worsened abdominal pain in the next 2 to 3 days she must return to the ER Final Clinical Impressions(s) / ED Diagnoses   Final diagnoses:  Sigmoid diverticulitis    ED Discharge Orders         Ordered    metroNIDAZOLE (FLAGYL) 500 MG tablet  2 times daily     04/09/19 0613    ciprofloxacin (CIPRO) 500 MG tablet  2 times daily     04/09/19 [id number]           Zadie RhineWickline, Monie Shere, MD 04/09/19 [phone]

## 2019-04-09 NOTE — ED Notes (Signed)
Patient verbalizes understanding of discharge instructions. Opportunity for questioning and answers were provided. Armband removed by staff, pt discharged from ED, verbalizing pain relief. Patient to leave facility with husband.

## 2019-04-09 NOTE — ED Notes (Signed)
ED Provider at bedside. 

## 2019-04-09 NOTE — ED Notes (Signed)
Pt ambulated to and from bathroom with little assistance.

## 2019-12-23 ENCOUNTER — Other Ambulatory Visit: Payer: Self-pay | Admitting: Neurological Surgery

## 2020-01-06 ENCOUNTER — Encounter (HOSPITAL_COMMUNITY)
Admission: RE | Admit: 2020-01-06 | Discharge: 2020-01-06 | Disposition: A | Discharge status: Home / Self Care | Payer: Medicare Other | Source: Ambulatory Visit | Attending: Neurological Surgery | Admitting: Neurological Surgery

## 2020-01-06 ENCOUNTER — Other Ambulatory Visit (HOSPITAL_COMMUNITY)
Admission: RE | Admit: 2020-01-06 | Discharge: 2020-01-06 | Disposition: A | Discharge status: Home / Self Care | Payer: Medicare Other | Source: Ambulatory Visit | Attending: Neurological Surgery | Admitting: Neurological Surgery

## 2020-01-06 ENCOUNTER — Other Ambulatory Visit: Payer: Self-pay

## 2020-01-06 ENCOUNTER — Encounter (HOSPITAL_COMMUNITY): Payer: Self-pay

## 2020-01-06 DIAGNOSIS — Z20822 Contact with and (suspected) exposure to covid-19: Secondary | ICD-10-CM | POA: Diagnosis not present

## 2020-01-06 DIAGNOSIS — Z01812 Encounter for preprocedural laboratory examination: Secondary | ICD-10-CM | POA: Diagnosis present

## 2020-01-06 LAB — CBC
HCT: 46.6 % — ABNORMAL HIGH (ref 36.0–46.0)
Hemoglobin: 15.5 g/dL — ABNORMAL HIGH (ref 12.0–15.0)
MCH: 31 pg (ref 26.0–34.0)
MCHC: 33.3 g/dL (ref 30.0–36.0)
MCV: 93.2 fL (ref 80.0–100.0)
Platelets: 262 10*3/uL (ref 150–400)
RBC: 5 MIL/uL (ref 3.87–5.11)
RDW: 12.8 % (ref 11.5–15.5)
WBC: 9.9 10*3/uL (ref 4.0–10.5)
nRBC: 0 % (ref 0.0–0.2)

## 2020-01-06 LAB — TYPE AND SCREEN
ABO/RH(D): O POS
Antibody Screen: NEGATIVE

## 2020-01-06 LAB — SARS CORONAVIRUS 2 (TAT 6-24 HRS): SARS Coronavirus 2: NEGATIVE

## 2020-01-06 LAB — ABO/RH: ABO/RH(D): O POS

## 2020-01-06 LAB — SURGICAL PCR SCREEN
MRSA, PCR: NEGATIVE
Staphylococcus aureus: NEGATIVE

## 2020-01-06 NOTE — Pre-Procedure Instructions (Signed)
[id number] [place], [place] - [address] AT [facility] [address] [facility] [address] [place] [address] Phone: [phone] Fax: [phone]      Your procedure is scheduled on Monday, May 17th.  Report to [facility] Main Entrance "A" at 5:30 A.M., and check in at the Admitting office.  Call this number if you have problems the morning of surgery:  [phone]  Call [phone] if you have any questions prior to your surgery date Monday-Friday 8am-4pm    Remember:  Do not eat or drink after midnight the night before your surgery    Take these medicines the morning of surgery with A SIP OF WATER  predniSONE if still taking.   As needed: famotidine (PEPCID)  gabapentin (NEURONTIN) HYDROcodone-acetaminophen (NORCO/VICODIN) or traMADol (ULTRAM). Eye drops  As of today, STOP taking any Aspirin (unless otherwise instructed by your surgeon) and Aspirin containing products, Aleve, Naproxen, Ibuprofen, Motrin, Advil, Goody's, BC's, all herbal medications, fish oil, and all vitamins.                      Do not wear jewelry, make up, or nail polish            Do not wear lotions, powders, perfumes, or deodorant.            Do not shave 48 hours prior to surgery.             Do not bring valuables to the hospital.            [facility] is not responsible for any belongings or valuables.  Do NOT Smoke (Tobacco/Vapping) or drink Alcohol 24 hours prior to your procedure If you use a CPAP at night, you may bring all equipment for your overnight stay.   Contacts, glasses, dentures or bridgework may not be worn into surgery.      For patients admitted to the hospital, discharge time will be determined by your treatment team.   Patients discharged the day of surgery will not be allowed to drive home, and someone needs to stay with them for 24 hours.    Special instructions:   [facility]- Preparing For Surgery  Before surgery, you can play an  important role. Because skin is not sterile, your skin needs to be as free of germs as possible. You can reduce the number of germs on your skin by washing with CHG (chlorahexidine gluconate) Soap before surgery.  CHG is an antiseptic cleaner which kills germs and bonds with the skin to continue killing germs even after washing.    Oral Hygiene is also important to reduce your risk of infection.  Remember - BRUSH YOUR TEETH THE MORNING OF SURGERY WITH YOUR REGULAR TOOTHPASTE  Please do not use if you have an allergy to CHG or antibacterial soaps. If your skin becomes reddened/irritated stop using the CHG.  Do not shave (including legs and underarms) for at least 48 hours prior to first CHG shower. It is OK to shave your face.  Please follow these instructions carefully.   1. Shower the NIGHT BEFORE SURGERY and the MORNING OF SURGERY with CHG Soap.   2. If you chose to wash your hair, wash your hair first as usual with your normal shampoo.  3. After you shampoo, rinse your hair and body thoroughly to remove the shampoo.  4. Use CHG as you would any other liquid soap. You can apply CHG  directly to the skin and wash gently with a scrungie or a clean washcloth.   5. Apply the CHG Soap to your body ONLY FROM THE NECK DOWN.  Do not use on open wounds or open sores. Avoid contact with your eyes, ears, mouth and genitals (private parts). Wash Face and genitals (private parts)  with your normal soap.   6. Wash thoroughly, paying special attention to the area where your surgery will be performed.  7. Thoroughly rinse your body with warm water from the neck down.  8. DO NOT shower/wash with your normal soap after using and rinsing off the CHG Soap.  9. Pat yourself dry with a CLEAN TOWEL.  10. Wear CLEAN PAJAMAS to bed the night before surgery, wear comfortable clothes the morning of surgery  11. Place CLEAN SHEETS on your bed the night of your first shower and DO NOT SLEEP WITH PETS.   Day of  Surgery:   Do not apply any deodorants/lotions.  Please wear clean clothes to the hospital/surgery center.   Remember to brush your teeth WITH YOUR REGULAR TOOTHPASTE.   Please read over the following fact sheets that you were given.

## 2020-01-06 NOTE — Progress Notes (Signed)
PCP - Dr. Juluis Rainier Cardiologist - denies  PPM/ICD - N/A Device Orders -N/A  Rep Notified - N/A  Chest x-ray - N/A EKG - N/A Stress Test -2017; seen in ED for CP. Low risk study. Diagnosed with GERD.  ECHO - denies Cardiac Cath - denies  Sleep Study - denies CPAP - denies  Blood Thinner Instructions: N/A Aspirin Instructions:N/A  ERAS Protcol -N/A PRE-SURGERY Ensure or G2-N/A   COVID TEST- scheduled for today after PAT appointment.    Anesthesia review: No  Patient denies shortness of breath, fever, cough and chest pain at PAT appointment   All instructions explained to the patient, with a verbal understanding of the material. Patient agrees to go over the instructions while at home for a better understanding. Patient also instructed to self quarantine after being tested for COVID-19. The opportunity to ask questions was provided.    Coronavirus Screening  Have you experienced the following symptoms:  Cough yes/no: No Fever (>100.59F)  yes/no: No Runny nose yes/no: No Sore throat yes/no: No Difficulty breathing/shortness of breath  yes/no: No  Have you or a family member traveled in the last 14 days and where? yes/no: No   If the patient indicates "YES" to the above questions, their PAT will be rescheduled to limit the exposure to others and, the surgeon will be notified. THE PATIENT WILL NEED TO BE ASYMPTOMATIC FOR 14 DAYS.   If the patient is not experiencing any of these symptoms, the PAT nurse will instruct them to NOT bring anyone with them to their appointment since they may have these symptoms or traveled as well.   Please remind your patients and families that hospital visitation restrictions are in effect and the importance of the restrictions.

## 2020-01-09 NOTE — Anesthesia Preprocedure Evaluation (Addendum)
Anesthesia Evaluation  Patient identified by MRN, date of birth, ID band Patient awake    Reviewed: Allergy & Precautions, NPO status , Patient's Chart, lab work & pertinent test results  Airway Mallampati: II  TM Distance: >3 FB Neck ROM: Full    Dental no notable dental hx.    Pulmonary neg pulmonary ROS,    Pulmonary exam normal breath sounds clear to auscultation       Cardiovascular negative cardio ROS Normal cardiovascular exam Rhythm:Regular Rate:Normal     Neuro/Psych  Neuromuscular disease negative psych ROS   GI/Hepatic Neg liver ROS, GERD  Medicated and Controlled,  Endo/Other  negative endocrine ROS  Renal/GU negative Renal ROS     Musculoskeletal negative musculoskeletal ROS (+)   Abdominal (+) + obese,   Peds  Hematology negative hematology ROS (+)   Anesthesia Other Findings Spinal stenosis, Lumbar region with neurogenic claudication  Reproductive/Obstetrics                            Anesthesia Physical Anesthesia Plan  ASA: II  Anesthesia Plan: General   Post-op Pain Management:    Induction: Intravenous  PONV Risk Score and Plan: 3 and Ondansetron, Dexamethasone and Treatment may vary due to age or medical condition  Airway Management Planned: Oral ETT  Additional Equipment:   Intra-op Plan:   Post-operative Plan: Extubation in OR  Informed Consent: I have reviewed the patients History and Physical, chart, labs and discussed the procedure including the risks, benefits and alternatives for the proposed anesthesia with the patient or authorized representative who has indicated his/her understanding and acceptance.     Dental advisory given  Plan Discussed with: CRNA  Anesthesia Plan Comments:        Anesthesia Quick Evaluation

## 2020-01-10 ENCOUNTER — Inpatient Hospital Stay (HOSPITAL_COMMUNITY): Payer: Medicare Other | Admitting: Anesthesiology

## 2020-01-10 ENCOUNTER — Inpatient Hospital Stay (HOSPITAL_COMMUNITY): Payer: Medicare Other

## 2020-01-10 ENCOUNTER — Other Ambulatory Visit: Payer: Self-pay

## 2020-01-10 ENCOUNTER — Encounter (HOSPITAL_COMMUNITY): Payer: Self-pay | Admitting: Neurological Surgery

## 2020-01-10 ENCOUNTER — Encounter (HOSPITAL_COMMUNITY)
Admission: RE | Disposition: A | Discharge status: Home / Self Care | Payer: Self-pay | Source: Home / Self Care | Attending: Neurological Surgery

## 2020-01-10 ENCOUNTER — Inpatient Hospital Stay (HOSPITAL_COMMUNITY)
Admission: RE | Admit: 2020-01-10 | Discharge: 2020-01-12 | DRG: 455 | Disposition: A | Discharge status: Home / Self Care | Payer: Medicare Other | Attending: Neurological Surgery | Admitting: Neurological Surgery

## 2020-01-10 DIAGNOSIS — K219 Gastro-esophageal reflux disease without esophagitis: Secondary | ICD-10-CM | POA: Diagnosis present

## 2020-01-10 DIAGNOSIS — M4726 Other spondylosis with radiculopathy, lumbar region: Secondary | ICD-10-CM | POA: Diagnosis present

## 2020-01-10 DIAGNOSIS — M5416 Radiculopathy, lumbar region: Secondary | ICD-10-CM | POA: Diagnosis present

## 2020-01-10 DIAGNOSIS — Z885 Allergy status to narcotic agent status: Secondary | ICD-10-CM | POA: Diagnosis not present

## 2020-01-10 DIAGNOSIS — Z79899 Other long term (current) drug therapy: Secondary | ICD-10-CM | POA: Diagnosis not present

## 2020-01-10 DIAGNOSIS — M48062 Spinal stenosis, lumbar region with neurogenic claudication: Principal | ICD-10-CM | POA: Diagnosis present

## 2020-01-10 DIAGNOSIS — Z85828 Personal history of other malignant neoplasm of skin: Secondary | ICD-10-CM

## 2020-01-10 DIAGNOSIS — Z419 Encounter for procedure for purposes other than remedying health state, unspecified: Secondary | ICD-10-CM

## 2020-01-10 SURGERY — POSTERIOR LUMBAR FUSION 2 LEVEL
Anesthesia: General | Site: Back

## 2020-01-10 MED ORDER — ONDANSETRON HCL 4 MG PO TABS: 4.0000 mg | ORAL_TABLET | Freq: Four times a day (QID) | ORAL | Status: DC | PRN

## 2020-01-10 MED ORDER — FENTANYL CITRATE (PF) 100 MCG/2ML IJ SOLN
INTRAMUSCULAR | Status: AC
  Filled 2020-01-10: qty 2

## 2020-01-10 MED ORDER — METHOCARBAMOL 500 MG PO TABS
ORAL_TABLET | ORAL | Status: AC
  Filled 2020-01-10: qty 1

## 2020-01-10 MED ORDER — FENTANYL CITRATE (PF) 100 MCG/2ML IJ SOLN
25.0000 ug | INTRAMUSCULAR | Status: DC | PRN
  Administered 2020-01-10 (×3): 25 ug via INTRAVENOUS

## 2020-01-10 MED ORDER — SODIUM CHLORIDE 0.9 % IV SOLN
INTRAVENOUS | Status: DC | PRN
  Administered 2020-01-10: 500 mL

## 2020-01-10 MED ORDER — MORPHINE SULFATE (PF) 2 MG/ML IV SOLN: 2.0000 mg | INTRAVENOUS | Status: DC | PRN

## 2020-01-10 MED ORDER — METHOCARBAMOL 1000 MG/10ML IJ SOLN
500.0000 mg | Freq: Four times a day (QID) | INTRAVENOUS | Status: DC | PRN
  Filled 2020-01-10: qty 5

## 2020-01-10 MED ORDER — POLYETHYLENE GLYCOL 3350 17 G PO PACK: 17.0000 g | PACK | Freq: Every day | ORAL | Status: DC | PRN

## 2020-01-10 MED ORDER — ONDANSETRON HCL 4 MG/2ML IJ SOLN
INTRAMUSCULAR | Status: DC | PRN
  Administered 2020-01-10: 4 mg via INTRAVENOUS

## 2020-01-10 MED ORDER — CEFAZOLIN SODIUM-DEXTROSE 2-4 GM/100ML-% IV SOLN
2.0000 g | Freq: Three times a day (TID) | INTRAVENOUS | Status: AC
  Administered 2020-01-10 – 2020-01-11 (×2): 2 g via INTRAVENOUS
  Filled 2020-01-10 (×2): qty 100

## 2020-01-10 MED ORDER — METHOCARBAMOL 500 MG PO TABS
500.0000 mg | ORAL_TABLET | Freq: Four times a day (QID) | ORAL | Status: DC | PRN
  Administered 2020-01-10: 500 mg via ORAL

## 2020-01-10 MED ORDER — PHENYLEPHRINE HCL-NACL 10-0.9 MG/250ML-% IV SOLN
INTRAVENOUS | Status: DC | PRN
Start: 2020-01-10 — End: 2020-01-10
  Administered 2020-01-10: 70 ug/min via INTRAVENOUS
  Administered 2020-01-10: 20 ug/min via INTRAVENOUS

## 2020-01-10 MED ORDER — DEXAMETHASONE SODIUM PHOSPHATE 10 MG/ML IJ SOLN
INTRAMUSCULAR | Status: DC | PRN
Start: 2020-01-10 — End: 2020-01-10
  Administered 2020-01-10: 10 mg via INTRAVENOUS

## 2020-01-10 MED ORDER — LIDOCAINE 2% (20 MG/ML) 5 ML SYRINGE
INTRAMUSCULAR | Status: DC | PRN
  Administered 2020-01-10: 60 mg via INTRAVENOUS

## 2020-01-10 MED ORDER — DOCUSATE SODIUM 100 MG PO CAPS
100.0000 mg | ORAL_CAPSULE | Freq: Two times a day (BID) | ORAL | Status: DC
  Administered 2020-01-10 – 2020-01-12 (×4): 100 mg via ORAL
  Filled 2020-01-10 (×4): qty 1

## 2020-01-10 MED ORDER — LACTATED RINGERS IV SOLN: INTRAVENOUS | Status: DC

## 2020-01-10 MED ORDER — LACTATED RINGERS IV SOLN: INTRAVENOUS | Status: DC | PRN

## 2020-01-10 MED ORDER — PROPOFOL 10 MG/ML IV BOLUS
INTRAVENOUS | Status: AC
  Filled 2020-01-10: qty 20

## 2020-01-10 MED ORDER — TRAMADOL HCL 50 MG PO TABS: 50.0000 mg | ORAL_TABLET | Freq: Two times a day (BID) | ORAL | Status: DC | PRN

## 2020-01-10 MED ORDER — CHLORHEXIDINE GLUCONATE CLOTH 2 % EX PADS: 6.0000 | MEDICATED_PAD | Freq: Once | CUTANEOUS | Status: DC

## 2020-01-10 MED ORDER — POLYETHYL GLYCOL-PROPYL GLYCOL 0.4-0.3 % OP GEL
Freq: Three times a day (TID) | OPHTHALMIC | Status: DC | PRN
  Filled 2020-01-10: qty 10

## 2020-01-10 MED ORDER — FAMOTIDINE 20 MG PO TABS: 20.0000 mg | ORAL_TABLET | Freq: Two times a day (BID) | ORAL | Status: DC | PRN

## 2020-01-10 MED ORDER — ACETAMINOPHEN 650 MG RE SUPP: 650.0000 mg | RECTAL | Status: DC | PRN

## 2020-01-10 MED ORDER — SODIUM CHLORIDE 0.9% FLUSH: 3.0000 mL | INTRAVENOUS | Status: DC | PRN

## 2020-01-10 MED ORDER — THROMBIN 5000 UNITS EX SOLR
CUTANEOUS | Status: AC
  Filled 2020-01-10: qty 5000

## 2020-01-10 MED ORDER — ACETAMINOPHEN 500 MG PO TABS: 1000.0000 mg | ORAL_TABLET | Freq: Once | ORAL | Status: AC

## 2020-01-10 MED ORDER — ROCURONIUM BROMIDE 10 MG/ML (PF) SYRINGE
PREFILLED_SYRINGE | INTRAVENOUS | Status: DC | PRN
  Administered 2020-01-10: 50 mg via INTRAVENOUS
  Administered 2020-01-10: 30 mg via INTRAVENOUS
  Administered 2020-01-10: 20 mg via INTRAVENOUS
  Administered 2020-01-10: 50 mg via INTRAVENOUS

## 2020-01-10 MED ORDER — HYDROCODONE-ACETAMINOPHEN 5-325 MG PO TABS
1.0000 | ORAL_TABLET | ORAL | Status: DC | PRN
  Administered 2020-01-10 – 2020-01-12 (×3): 1 via ORAL
  Filled 2020-01-10 (×3): qty 1

## 2020-01-10 MED ORDER — ACETAMINOPHEN 500 MG PO TABS
ORAL_TABLET | ORAL | Status: AC
  Administered 2020-01-10: 1000 mg via ORAL
  Filled 2020-01-10: qty 2

## 2020-01-10 MED ORDER — CEFAZOLIN SODIUM-DEXTROSE 2-4 GM/100ML-% IV SOLN
2.0000 g | INTRAVENOUS | Status: AC
  Administered 2020-01-10 (×2): 2 g via INTRAVENOUS

## 2020-01-10 MED ORDER — KETOROLAC TROMETHAMINE 15 MG/ML IJ SOLN
7.5000 mg | Freq: Four times a day (QID) | INTRAMUSCULAR | Status: AC
  Administered 2020-01-10 – 2020-01-11 (×3): 7.5 mg via INTRAVENOUS
  Filled 2020-01-10 (×3): qty 1

## 2020-01-10 MED ORDER — BUPIVACAINE HCL (PF) 0.5 % IJ SOLN
INTRAMUSCULAR | Status: AC
  Filled 2020-01-10: qty 30

## 2020-01-10 MED ORDER — 0.9 % SODIUM CHLORIDE (POUR BTL) OPTIME
TOPICAL | Status: DC | PRN
  Administered 2020-01-10: 1000 mL

## 2020-01-10 MED ORDER — FLEET ENEMA 7-19 GM/118ML RE ENEM: 1.0000 | ENEMA | Freq: Once | RECTAL | Status: DC | PRN

## 2020-01-10 MED ORDER — ONDANSETRON HCL 4 MG/2ML IJ SOLN: 4.0000 mg | Freq: Four times a day (QID) | INTRAMUSCULAR | Status: DC | PRN

## 2020-01-10 MED ORDER — SODIUM CHLORIDE 0.9% FLUSH
3.0000 mL | Freq: Two times a day (BID) | INTRAVENOUS | Status: DC
  Administered 2020-01-10: 3 mL via INTRAVENOUS

## 2020-01-10 MED ORDER — LIDOCAINE-EPINEPHRINE 1 %-1:100000 IJ SOLN
INTRAMUSCULAR | Status: AC
  Filled 2020-01-10: qty 1

## 2020-01-10 MED ORDER — ONDANSETRON HCL 4 MG/2ML IJ SOLN: 4.0000 mg | Freq: Once | INTRAMUSCULAR | Status: DC | PRN

## 2020-01-10 MED ORDER — THROMBIN 20000 UNITS EX SOLR
CUTANEOUS | Status: DC | PRN
  Administered 2020-01-10: 20 mL

## 2020-01-10 MED ORDER — FENTANYL CITRATE (PF) 100 MCG/2ML IJ SOLN
INTRAMUSCULAR | Status: DC | PRN
  Administered 2020-01-10: 50 ug via INTRAVENOUS
  Administered 2020-01-10: 100 ug via INTRAVENOUS
  Administered 2020-01-10: 50 ug via INTRAVENOUS

## 2020-01-10 MED ORDER — CEFAZOLIN SODIUM-DEXTROSE 2-4 GM/100ML-% IV SOLN
INTRAVENOUS | Status: AC
  Filled 2020-01-10: qty 100

## 2020-01-10 MED ORDER — ALBUMIN HUMAN 5 % IV SOLN: INTRAVENOUS | Status: DC | PRN

## 2020-01-10 MED ORDER — PROPOFOL 10 MG/ML IV BOLUS
INTRAVENOUS | Status: DC | PRN
  Administered 2020-01-10: 150 mg via INTRAVENOUS

## 2020-01-10 MED ORDER — SUGAMMADEX SODIUM 200 MG/2ML IV SOLN
INTRAVENOUS | Status: DC | PRN
  Administered 2020-01-10: 160 mg via INTRAVENOUS

## 2020-01-10 MED ORDER — PHENOL 1.4 % MT LIQD: 1.0000 | OROMUCOSAL | Status: DC | PRN

## 2020-01-10 MED ORDER — BISACODYL 10 MG RE SUPP: 10.0000 mg | Freq: Every day | RECTAL | Status: DC | PRN

## 2020-01-10 MED ORDER — LIDOCAINE-EPINEPHRINE 1 %-1:100000 IJ SOLN
INTRAMUSCULAR | Status: DC | PRN
  Administered 2020-01-10: 10 mL

## 2020-01-10 MED ORDER — MENTHOL 3 MG MT LOZG: 1.0000 | LOZENGE | OROMUCOSAL | Status: DC | PRN

## 2020-01-10 MED ORDER — BUPIVACAINE HCL (PF) 0.5 % IJ SOLN
INTRAMUSCULAR | Status: DC | PRN
  Administered 2020-01-10: 30 mL

## 2020-01-10 MED ORDER — THROMBIN 5000 UNITS EX SOLR
OROMUCOSAL | Status: DC | PRN
  Administered 2020-01-10 (×2): 5 mL

## 2020-01-10 MED ORDER — ACETAMINOPHEN 325 MG PO TABS
650.0000 mg | ORAL_TABLET | ORAL | Status: DC | PRN
  Administered 2020-01-11: 650 mg via ORAL
  Filled 2020-01-10: qty 2

## 2020-01-10 MED ORDER — GABAPENTIN 300 MG PO CAPS: 300.0000 mg | ORAL_CAPSULE | Freq: Two times a day (BID) | ORAL | Status: DC | PRN

## 2020-01-10 MED ORDER — FENTANYL CITRATE (PF) 250 MCG/5ML IJ SOLN
INTRAMUSCULAR | Status: AC
  Filled 2020-01-10: qty 5

## 2020-01-10 MED ORDER — THROMBIN 20000 UNITS EX SOLR
CUTANEOUS | Status: AC
  Filled 2020-01-10: qty 20000

## 2020-01-10 MED ORDER — SENNA 8.6 MG PO TABS
1.0000 | ORAL_TABLET | Freq: Two times a day (BID) | ORAL | Status: DC
  Administered 2020-01-10 – 2020-01-12 (×4): 8.6 mg via ORAL
  Filled 2020-01-10 (×4): qty 1

## 2020-01-10 SURGICAL SUPPLY — 68 items
ADH SKN CLS APL DERMABOND .7 (GAUZE/BANDAGES/DRESSINGS) ×1
APL SRG 60D 8 XTD TIP BNDBL (TIP)
BAG DECANTER FOR FLEXI CONT (MISCELLANEOUS) ×3 IMPLANT
BASKET BONE COLLECTION (BASKET) ×3 IMPLANT
BLADE CLIPPER SURG (BLADE) IMPLANT
BONE CANC CHIPS 20CC PCAN1/4 (Bone Implant) ×3 IMPLANT
BUR MATCHSTICK NEURO 3.0 LAGG (BURR) ×3 IMPLANT
CAGE COROENT LG 10X9X23-12 (Cage) ×4 IMPLANT
CAGE MAS PLIF 9X9X23-8 LUMBAR (Cage) ×4 IMPLANT
CANISTER SUCT 3000ML PPV (MISCELLANEOUS) ×3 IMPLANT
CHIPS CANC BONE 20CC PCAN1/4 (Bone Implant) ×1 IMPLANT
CNTNR URN SCR LID CUP LEK RST (MISCELLANEOUS) ×1 IMPLANT
CONT SPEC 4OZ STRL OR WHT (MISCELLANEOUS) ×3
COVER BACK TABLE 60X90IN (DRAPES) ×3 IMPLANT
COVER WAND RF STERILE (DRAPES) ×3 IMPLANT
DECANTER SPIKE VIAL GLASS SM (MISCELLANEOUS) ×3 IMPLANT
DERMABOND ADVANCED (GAUZE/BANDAGES/DRESSINGS) ×2
DERMABOND ADVANCED .7 DNX12 (GAUZE/BANDAGES/DRESSINGS) ×1 IMPLANT
DEVICE DISSECT PLASMABLAD 3.0S (MISCELLANEOUS) ×1 IMPLANT
DRAPE C-ARM 42X72 X-RAY (DRAPES) ×6 IMPLANT
DRAPE HALF SHEET 40X57 (DRAPES) IMPLANT
DRAPE LAPAROTOMY 100X72X124 (DRAPES) ×3 IMPLANT
DURAPREP 26ML APPLICATOR (WOUND CARE) ×3 IMPLANT
DURASEAL APPLICATOR TIP (TIP) IMPLANT
DURASEAL SPINE SEALANT 3ML (MISCELLANEOUS) IMPLANT
ELECT REM PT RETURN 9FT ADLT (ELECTROSURGICAL) ×3
ELECTRODE REM PT RTRN 9FT ADLT (ELECTROSURGICAL) ×1 IMPLANT
GAUZE 4X4 16PLY RFD (DISPOSABLE) IMPLANT
GAUZE SPONGE 4X4 12PLY STRL (GAUZE/BANDAGES/DRESSINGS) ×3 IMPLANT
GLOVE BIOGEL PI IND STRL 8.5 (GLOVE) ×2 IMPLANT
GLOVE BIOGEL PI INDICATOR 8.5 (GLOVE) ×4
GLOVE ECLIPSE 8.5 STRL (GLOVE) ×6 IMPLANT
GOWN STRL REUS W/ TWL LRG LVL3 (GOWN DISPOSABLE) IMPLANT
GOWN STRL REUS W/ TWL XL LVL3 (GOWN DISPOSABLE) IMPLANT
GOWN STRL REUS W/TWL 2XL LVL3 (GOWN DISPOSABLE) ×6 IMPLANT
GOWN STRL REUS W/TWL LRG LVL3 (GOWN DISPOSABLE)
GOWN STRL REUS W/TWL XL LVL3 (GOWN DISPOSABLE)
GRAFT BNE CANC CHIPS 1-8 20CC (Bone Implant) IMPLANT
HEMOSTAT POWDER KIT SURGIFOAM (HEMOSTASIS) ×4 IMPLANT
KIT BASIN OR (CUSTOM PROCEDURE TRAY) ×3 IMPLANT
KIT INFUSE SMALL (Orthopedic Implant) ×2 IMPLANT
KIT TURNOVER KIT B (KITS) ×3 IMPLANT
MILL MEDIUM DISP (BLADE) ×3 IMPLANT
NDL SPNL 18GX3.5 QUINCKE PK (NEEDLE) IMPLANT
NEEDLE HYPO 22GX1.5 SAFETY (NEEDLE) ×3 IMPLANT
NEEDLE SPNL 18GX3.5 QUINCKE PK (NEEDLE) IMPLANT
NS IRRIG 1000ML POUR BTL (IV SOLUTION) ×3 IMPLANT
PACK LAMINECTOMY NEURO (CUSTOM PROCEDURE TRAY) ×3 IMPLANT
PAD ARMBOARD 7.5X6 YLW CONV (MISCELLANEOUS) ×9 IMPLANT
PATTIES SURGICAL .5 X1 (DISPOSABLE) ×5 IMPLANT
PATTIES SURGICAL 1X1 (DISPOSABLE) ×2 IMPLANT
PLASMABLADE 3.0S (MISCELLANEOUS) ×3
ROD RELIN-O LORD 5.5X65MM (Rod) ×4 IMPLANT
SCREW LOCK RELINE 5.5 TULIP (Screw) ×12 IMPLANT
SCREW RELINE-O POLY 6.5X45 (Screw) ×12 IMPLANT
SPONGE LAP 4X18 RFD (DISPOSABLE) ×2 IMPLANT
SPONGE SURGIFOAM ABS GEL 100 (HEMOSTASIS) ×3 IMPLANT
SUT PROLENE 6 0 BV (SUTURE) IMPLANT
SUT VIC AB 1 CT1 18XBRD ANBCTR (SUTURE) ×1 IMPLANT
SUT VIC AB 1 CT1 8-18 (SUTURE) ×3
SUT VIC AB 2-0 CP2 18 (SUTURE) ×3 IMPLANT
SUT VIC AB 3-0 SH 8-18 (SUTURE) ×3 IMPLANT
SYR 3ML LL SCALE MARK (SYRINGE) ×12 IMPLANT
SYR 5ML LL (SYRINGE) IMPLANT
TOWEL GREEN STERILE (TOWEL DISPOSABLE) ×3 IMPLANT
TOWEL GREEN STERILE FF (TOWEL DISPOSABLE) ×3 IMPLANT
TRAY FOLEY MTR SLVR 16FR STAT (SET/KITS/TRAYS/PACK) ×3 IMPLANT
WATER STERILE IRR 1000ML POUR (IV SOLUTION) ×3 IMPLANT

## 2020-01-10 NOTE — Anesthesia Postprocedure Evaluation (Signed)
Anesthesia Post Note  Patient: Jade Bishop  Procedure(s) Performed: Lumbar Three-Four Lumbar Four-Five Posterior lumbar interbody fusion (N/A Back)     Patient location during evaluation: PACU Anesthesia Type: General Level of consciousness: awake Pain management: pain level controlled Vital Signs Assessment: post-procedure vital signs reviewed and stable Respiratory status: spontaneous breathing, nonlabored ventilation, respiratory function stable and patient connected to nasal cannula oxygen Cardiovascular status: blood pressure returned to baseline and stable Postop Assessment: no apparent nausea or vomiting Anesthetic complications: no    Last Vitals:  Vitals:   01/10/20 1445 01/10/20 1517  BP: (!) 113/55 132/79  Pulse: 72 75  Resp: 14 16  Temp: 36.7 C 36.8 C  SpO2: 97% 99%    Last Pain:  Vitals:   01/10/20 1517  TempSrc: Oral  PainSc:                  Jade Bishop

## 2020-01-10 NOTE — Transfer of Care (Signed)
Immediate Anesthesia Transfer of Care Note  Patient: Jade Bishop  Procedure(s) Performed: Lumbar Three-Four Lumbar Four-Five Posterior lumbar interbody fusion (N/A Back)  Patient Location: PACU  Anesthesia Type:General  Level of Consciousness: awake and alert   Airway & Oxygen Therapy: Patient Spontanous Breathing and Patient connected to nasal cannula oxygen  Post-op Assessment: Report given to RN and Post -op Vital signs reviewed and stable  Post vital signs: Reviewed and stable  Last Vitals:  Vitals Value Taken Time  BP 116/52 01/10/20 1228  Temp 36.9 C 01/10/20 1228  Pulse 66 01/10/20 1229  Resp 5 01/10/20 1229  SpO2 100 % 01/10/20 1229  Vitals shown include unvalidated device data.  Last Pain:  Vitals:   01/10/20 0643  TempSrc:   PainSc: 0-No pain         Complications: No apparent anesthesia complications

## 2020-01-10 NOTE — Progress Notes (Signed)
Orthopedic Tech Progress Note Patient Details:  Jade Bishop 04/06/1936 [id number] Called in order to [facility] for an ASPEN LUMBAR Patient ID: Jade Bishop, female   DOB: [birth year], 84 y.o.   MRN: [id number]   [name] 01/10/2020, 3:28 PM

## 2020-01-10 NOTE — H&P (Signed)
Jade Bishop is an 84 y.o. female.   Chief Complaint: Back pain with weakness severe lumbar stenosis HPI: Jade Bishop is an 84 year old individual whose had significant progressive spinal stenosis from L3-L5 for a number of years.  Have treated her conservatively with occasional epidural injections but this has become progressively more refractory and more recently her weakness has decreased her ability to ambulate independently even around the house for activities of daily living.  I have advised previously surgical decompression and stabilization lumbar spine.  She is now admitted for this procedure from L3-L5.  Past Medical History:  Diagnosis Date  . Chest pain 07/03/2016  . GERD (gastroesophageal reflux disease)   . Sciatica, right side   . Spinal stenosis     Past Surgical History:  Procedure Laterality Date  . ABDOMINAL HYSTERECTOMY  2002  . APPENDECTOMY    . SKIN CANCER EXCISION     located on abdominal wall    Family History  Problem Relation Age of Onset  . Stroke Mother 75   Social History:  reports that she has never smoked. She has never used smokeless tobacco. She reports that she does not drink alcohol or use drugs.  Allergies:  Allergies  Allergen Reactions  . Codeine Other (See Comments)    Unknown reaction type    Medications Prior to Admission  Medication Sig Dispense Refill  . famotidine (PEPCID) 20 MG tablet Take 20 mg by mouth 2 (two) times daily as needed for heartburn or indigestion.    Marland Kitchen HYDROcodone-acetaminophen (NORCO/VICODIN) 5-325 MG tablet Take 1 tablet by mouth every 4 (four) hours as needed. (Patient taking differently: Take 1 tablet by mouth every 6 (six) hours as needed (pain.). ) 10 tablet 0  . Polyethyl Glycol-Propyl Glycol (SYSTANE OP) Place 1 drop into both eyes 3 (three) times daily as needed (dry/irritated eyes.).     Marland Kitchen predniSONE (STERAPRED UNI-PAK 48 TAB) 10 MG (48) TBPK tablet Take 10-60 mg by mouth See admin instructions. Take these  number of tablets on consecutive days:6-6-5-5-4-4-3-3-2-2-1-1    . gabapentin (NEURONTIN) 300 MG capsule Take 1 capsule (300 mg total) by mouth 2 (two) times daily for 10 days. (Patient taking differently: Take 300 mg by mouth 2 (two) times daily as needed (pain.). ) 20 capsule 0  . traMADol (ULTRAM) 50 MG tablet Take 1 tablet by mouth 2 (two) times daily as needed (pain.).       No results found for this or any previous visit (from the past 48 hour(s)). No results found.  Review of Systems  Constitutional: Positive for activity change.  HENT: Negative.   Eyes: Negative.   Respiratory: Negative.   Cardiovascular: Negative.   Gastrointestinal: Negative.   Endocrine: Negative.   Genitourinary: Negative.   Musculoskeletal: Positive for back pain.  Allergic/Immunologic: Negative.   Neurological: Positive for weakness and numbness.  Hematological: Negative.   Psychiatric/Behavioral: Negative.     Blood pressure (!) 147/52, pulse 73, temperature 98.2 F (36.8 C), temperature source Oral, resp. rate 17, height 5\' 2"  (1.575 m), weight 80.3 kg, SpO2 98 %. Physical Exam  Constitutional: She is oriented to person, place, and time. She appears well-developed and well-nourished.  HENT:  Head: Normocephalic and atraumatic.  Eyes: Pupils are equal, round, and reactive to light. Conjunctivae and EOM are normal.  Cardiovascular: Normal rate and regular rhythm.  Respiratory: Effort normal and breath sounds normal.  GI: Soft. Bowel sounds are normal.  Musculoskeletal:     Cervical back: Normal range  of motion and neck supple.     Comments: Patrick's maneuver is negative bilaterally.  Neurological: She is alert and oriented to person, place, and time.  4 out of 5 strength in iliopsoas quadricep tibialis anterior and gastrocs decreased tone and bulk in all lower extremity musculature absent reflexes in the patellae and Achilles straight leg raising is positive at 40 degrees in either lower extremity  Patrick's maneuver is negative.  Cranial nerve examination is normal upper extremity strength and reflexes are intact.  Skin: Skin is warm and dry.  Psychiatric: She has a normal mood and affect. Her behavior is normal. Judgment and thought content normal.     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  Plan: Decompression of fusion L3-4 L4-5 posterior lumbar interbody arthrodesis using peek spacers local autograft allograft and infuse   Stefani Dama, MD 01/10/2020, 7:35 AM

## 2020-01-10 NOTE — Anesthesia Procedure Notes (Signed)
Procedure Name: Intubation Date/Time: 01/10/2020 7:50 AM Performed by: Elliot Dally, CRNA Pre-anesthesia Checklist: Patient identified, Emergency Drugs available, Suction available and Patient being monitored Patient Re-evaluated:Patient Re-evaluated prior to induction Oxygen Delivery Method: Circle System Utilized Preoxygenation: Pre-oxygenation with 100% oxygen Induction Type: IV induction Ventilation: Mask ventilation without difficulty Laryngoscope Size: Miller and 2 Grade View: Grade I Tube type: Oral Tube size: 7.0 mm Number of attempts: 1 Airway Equipment and Method: Stylet and Oral airway Placement Confirmation: ETT inserted through vocal cords under direct vision,  positive ETCO2 and breath sounds checked- equal and bilateral Secured at: 21 cm Tube secured with: Tape Dental Injury: Teeth and Oropharynx as per pre-operative assessment

## 2020-01-10 NOTE — Progress Notes (Deleted)
Attempted pacemaker interrogation via transmitter x 3. Would not transmit. Notified Leta Jungling from [employer] of status. Ok to send back to patients room.

## 2020-01-10 NOTE — Op Note (Signed)
Date of surgery: 01/10/2020 Preoperative diagnosis: Lumbar spinal stenosis L3-4 and L4-5 with neurogenic claudication, lumbar radiculopathy. Postoperative diagnosis: Same Procedure: Lumbar laminectomy L3-4 and L4-5 with decompression of the L3 the L4 the L5 nerve roots with more work than required for simple interbody technique.  Posterior lumbar interbody fusion using peek spacers autograft allograft and infuse arthrodesis.  Posterior lateral arthrodesis L3-L5 with autograft allograft and infuse.  Segmental fixation L3-L5 with pedicle screws.  Fluoroscopic imaging. Surgeon: Barnett Abu First assistant: Ervin Knack, MD Anesthesia: General endotracheal Indications: Jade Bishop is an 84 year old individual who has had significant back and bilateral lower extremity pain and more recently weakness she has severe spondylitic stenosis at L3-4 and L4-5 and had been question about the weakness and because of the intractable nature of the pain she is now being admitted to undergo surgical decompression arthrodesis.  Procedure: The patient was brought to the operating room supine on the stretcher.  After the smooth induction of general endotracheal anesthesia, she was turned prone.  The back was prepped with alcohol DuraPrep and draped in a sterile fashion.  Midline incision was created and carried down to the lumbodorsal fascia.  The fascia was opened on either side of midline to expose the spinous processes which were identified as L3 and L4 positively on a radiograph and a subperiosteal dissection was carried out along the spinous processes out to the lateral gutters to expose the interlaminar space of L3-4 the facet at L3-4 the transverse process of L3-L4 and then L5 inferiorly.  Interlaminar space at L4-5 was exposed similarly after packing this area off and dissecting it open laminectomy was created removing the inferior margin lamina of L3 out to and including the entirety of the facet.  This was done  bilaterally then the entire laminar arch of L4 was removed after drilling through the pars region towards the lamina.  Once the large portions of bone were removed smaller portions of bone were removed using a 2 mm dissector and high-speed drill and a 2 and a 3 mm Kerrison punch this allowed exposure of the L3 the L4 and the L5 nerve roots which were individually decompressed.  Once they were decompressed the disc spaces were isolated the space at L3-4 was then entered and the complete discectomy was performed in a bilateral fashion.  The interspace was then sized for an appropriate size spacer and at this level 9 x 9 x 23 mm spacer with 8 degrees lordosis was chosen.  This was filled with a combination of autograft allograft and infuse and a total volume of 16 cc of bone graft was fit into the interspace at L3-4.  Then an L4-5 similar process was repeated and here a 10 x 9 x 23 mm spacer with 12 degrees lordosis was chosen again 16 cc of graft was packed into the interspace along with the 2 spacers.  When the grafting was completed care was taken to make sure that the individual nerve roots remained well decompressed and then pedicle entry sites were chosen at L3-L4 and L5 6.5 x 45 mm screws were placed into each of these pedicles under fluoroscopic guidance and prior to placing the rods into this area the lateral gutters which had previously been decorticated and intertransverse spaces from L3 L4-L5 was packed with an additional 6 cc of bone graft into each lateral gutter.  The rods were then placed and 65 mm precontoured rods were used to connect the screws from L3-L5.  Once this was torqued  down the neutral construct another 6 cc of bone graft was packed into each lateral gutter lateral to the rods themselves.  With this hemostasis was checked and the wound and the lumbodorsal fascia was closed with #1 Vicryl interrupted fashion 2-0 Vicryl was used in subcutaneous tissues 3-0 Vicryl subcuticularly and Dermabond  was placed on the skin total blood loss for the procedure was 500 cc 200 cc of Cell Saver blood was returned to the patient.

## 2020-01-10 NOTE — Progress Notes (Signed)
Patient ID: Jade Bishop, female   DOB: 1936/03/01, 84 y.o.   MRN: [id number] Vital signs are stable Patient is awake and alert Motor function in the lower extremities is good Pain is under reasonably good control.

## 2020-01-11 LAB — CBC
HCT: 34.2 % — ABNORMAL LOW (ref 36.0–46.0)
Hemoglobin: 11.3 g/dL — ABNORMAL LOW (ref 12.0–15.0)
MCH: 30.7 pg (ref 26.0–34.0)
MCHC: 33 g/dL (ref 30.0–36.0)
MCV: 92.9 fL (ref 80.0–100.0)
Platelets: 163 10*3/uL (ref 150–400)
RBC: 3.68 MIL/uL — ABNORMAL LOW (ref 3.87–5.11)
RDW: 13.3 % (ref 11.5–15.5)
WBC: 13.4 10*3/uL — ABNORMAL HIGH (ref 4.0–10.5)
nRBC: 0 % (ref 0.0–0.2)

## 2020-01-11 LAB — BASIC METABOLIC PANEL
Anion gap: 6 (ref 5–15)
BUN: 9 mg/dL (ref 8–23)
CO2: 27 mmol/L (ref 22–32)
Calcium: 8.5 mg/dL — ABNORMAL LOW (ref 8.9–10.3)
Chloride: 106 mmol/L (ref 98–111)
Creatinine, Ser: 0.8 mg/dL (ref 0.44–1.00)
GFR calc Af Amer: >60 mL/min (ref >60)
GFR calc non Af Amer: >60 mL/min (ref >60)
Glucose, Bld: 132 mg/dL — ABNORMAL HIGH (ref 70–99)
Potassium: 4.6 mmol/L (ref 3.5–5.1)
Sodium: 139 mmol/L (ref 135–145)

## 2020-01-11 MED ORDER — DEXAMETHASONE 4 MG PO TABS
4.0000 mg | ORAL_TABLET | Freq: Every day | ORAL | Status: DC
  Administered 2020-01-11 – 2020-01-12 (×2): 4 mg via ORAL
  Filled 2020-01-11 (×2): qty 1

## 2020-01-11 MED FILL — Thrombin For Soln 5000 Unit: CUTANEOUS | Qty: 5000 | Status: AC

## 2020-01-11 NOTE — Progress Notes (Signed)
Occupational Therapy Evaluation Patient Details Name: Jade Bishop MRN: [id number] DOB: [birth year] Today's Date: 01/11/2020    History of Present Illness Patient is a 84 y/o female who presents s/p L3-5 PLIF 01/10/20. PMH includes sciatic and chest pain.   Clinical Impression   Completed education regarding management of ADL and functional mobility while adhering to back precautions. Pt able to return demonstrate. Husband/family able to assist as needed. Educated on availability of AE. No further Ot needed. Pt states she feels comfortable going home.     Follow Up Recommendations  No OT follow up;Supervision - Intermittent    Equipment Recommendations  3 in 1 bedside commode    Recommendations for Other Services       Precautions / Restrictions Precautions Precautions: Back Precaution Booklet Issued: Yes (comment) Required Braces or Orthoses: Spinal Brace Spinal Brace: Lumbar corset;Applied in sitting position Restrictions Weight Bearing Restrictions: No      Mobility Bed Mobility Overal bed mobility: Modified Independent(good return demonstration of techniques)                Transfers Overall transfer level: Needs assistance   Transfers: Sit to/from Stand Sit to Stand: Supervision              Balance Overall balance assessment: Mild deficits observed, not formally tested                                         ADL either performed or assessed with clinical judgement   ADL Overall ADL's : Needs assistance/impaired                                     Functional mobility during ADLs: Supervision/safety;Rolling walker General ADL Comments: Pt requires min A for LB ADL . Educated on use of AE and compensatory techniques to increase independence with ADL. Pt able to return demonstrate. Educated on home safety and reducing risk of falls. Husband present for educaiton.      Vision         Perception     Praxis       Pertinent Vitals/Pain Pain Assessment: 0-10 Pain Score: 1  Pain Location: back Pain Descriptors / Indicators: Discomfort Pain Intervention(s): Limited activity within patient's tolerance     Hand Dominance     Extremity/Trunk Assessment Upper Extremity Assessment Upper Extremity Assessment: Overall WFL for tasks assessed   Lower Extremity Assessment Lower Extremity Assessment: Defer to PT evaluation   Cervical / Trunk Assessment Cervical / Trunk Assessment: Other exceptions(back surgery)   Communication Communication Communication: No difficulties   Cognition Arousal/Alertness: Awake/alert Behavior During Therapy: WFL for tasks assessed/performed Overall Cognitive Status: Within Functional Limits for tasks assessed                                     General Comments       Exercises     Shoulder Instructions      Home Living Family/patient expects to be discharged to:: Private residence Living Arrangements: Spouse/significant other Available Help at Discharge: Family;Available 24 hours/day Type of Home: House Home Access: Stairs to enter [employer] of Steps: 4 Entrance Stairs-Rails: Right;Left Home Layout: One level     Bathroom Shower/Tub: Walk-in shower  Bathroom Toilet: Pharmacist, community: Yes How Accessible: Accessible via walker Home Equipment: Walker - 2 wheels;Grab bars - tub/shower          Prior Functioning/Environment Level of Independence: Independent with assistive device(s);Needs assistance    ADL's / Homemaking Assistance Needed: assistance for socks   Comments: used RW         OT Problem List: Decreased strength;Decreased range of motion;Decreased knowledge of use of DME or AE;Decreased knowledge of precautions;Pain      OT Treatment/Interventions:      OT Goals(Current goals can be found in the care plan section) Acute Rehab OT Goals Patient Stated Goal: to go home OT Goal  Formulation: All assessment and education complete, DC therapy  OT Frequency:     Barriers to D/C:            Co-evaluation              AM-PAC OT "6 Clicks" Daily Activity     Outcome Measure Help from another person eating meals?: None Help from another person taking care of personal grooming?: A Little Help from another person toileting, which includes using toliet, bedpan, or urinal?: A Little Help from another person bathing (including washing, rinsing, drying)?: A Little Help from another person to put on and taking off regular upper body clothing?: A Little Help from another person to put on and taking off regular lower body clothing?: A Little 6 Click Score: 19   End of Session Equipment Utilized During Treatment: Rolling walker;Back brace Nurse Communication: Mobility status;Other (comment)(needs 3in1)  Activity Tolerance: Patient tolerated treatment well Patient left: in bed;with call bell/phone within reach;with family/visitor present  OT Visit Diagnosis: Unsteadiness on feet (R26.81);Muscle weakness (generalized) (M62.81);Pain Pain - part of body: (back)                Time: 6283-1517 OT Time Calculation (min): 34 min Charges:  OT General Charges $OT Visit: 1 Visit OT Evaluation $OT Eval Low Complexity: 1 Low OT Treatments $Self Care/Home Management : 8-22 mins  Luisa Dago, OT/L   Acute OT Clinical Specialist Acute Rehabilitation Services Pager [phone] Office [phone]   [facility] 01/11/2020, 10:02 AM

## 2020-01-11 NOTE — Evaluation (Signed)
Physical Therapy Evaluation Patient Details Name: Jade Bishop MRN: [id number] DOB: 09/14/1935 Today's Date: 01/11/2020   History of Present Illness  Patient is a 84 y/o female who presents s/p L3-5 PLIF 01/10/20. PMH includes sciatic and chest pain.  Clinical Impression  Patient presents with pain and post surgical deficits s/p above surgery. Pt reports being Mod I with ambulation and needing some assist with LB ADls PTA. Has not been doing any cooking/cleaning recently. Today, pt tolerated bed mobility, transfers, gait training and stair training with supervision for safety. Education re: back precautions, handout, log roll technique, positioning, walking program, brace and healing time/expected progression. Pt has support from spouse at d/c. Will follow acutely to maximize independence and mobility prior to return home.    Follow Up Recommendations No PT follow up;Supervision - Intermittent    Equipment Recommendations  None recommended by PT    Recommendations for Other Services       Precautions / Restrictions Precautions Precautions: Back Precaution Booklet Issued: Yes (comment) Precaution Comments: Reviewed back precautions and handout Required Braces or Orthoses: Spinal Brace Spinal Brace: Lumbar corset;Applied in sitting position Restrictions Weight Bearing Restrictions: No      Mobility  Bed Mobility Overal bed mobility: Modified Independent;Needs Assistance Bed Mobility: Sidelying to Sit;Sit to Sidelying;Rolling Rolling: Modified independent (Device/Increase time) Sidelying to sit: Modified independent (Device/Increase time)     Sit to sidelying: Modified independent (Device/Increase time) General bed mobility comments: HOB mostly flat to simulate home; use of rail. Cues for log roll technique.  Transfers Overall transfer level: Needs assistance Equipment used: Rolling walker (2 wheeled) Transfers: Sit to/from Stand Sit to Stand: Supervision          General transfer comment: Supervision for safety.  Ambulation/Gait Ambulation/Gait assistance: Supervision Gait Distance (Feet): 400 Feet Assistive device: Rolling walker (2 wheeled) Gait Pattern/deviations: Step-through pattern;Decreased stride length Gait velocity: decreased Gait velocity interpretation: 1.31 - 2.62 ft/sec, indicative of limited community ambulator General Gait Details: Slow, steady gait with RW for support.  Stairs Stairs: Yes Stairs assistance: Min guard Stair Management: Step to pattern;One rail Right Number of Stairs: 4 General stair comments: Cues for technique and safety.  Wheelchair Mobility    Modified Rankin (Stroke Patients Only)       Balance Overall balance assessment: Mild deficits observed, not formally tested                                           Pertinent Vitals/Pain Pain Assessment: 0-10 Pain Score: 2  Pain Location: back; RLE distal to knee and into foot Pain Descriptors / Indicators: Discomfort Pain Intervention(s): Repositioned;Monitored during session    Home Living Family/patient expects to be discharged to:: Private residence Living Arrangements: Spouse/significant other Available Help at Discharge: Family;Available 24 hours/day Type of Home: House Home Access: Stairs to enter Entrance Stairs-Rails: Doctor, general practice of Steps: 4 Home Layout: One level Home Equipment: Walker - 2 wheels;Grab bars - tub/shower      Prior Function Level of Independence: Independent with assistive device(s);Needs assistance   Gait / Transfers Assistance Needed: Uses RW for ambulation PTA.  ADL's / Homemaking Assistance Needed: assistance for socks; spouse doing all IADLs. Loves to quill, make jewelry and paint.  Comments: Daughter in law will be staying for ~1 week to assist at d/c.     Hand Dominance        Extremity/Trunk  Assessment   Upper Extremity Assessment Upper Extremity Assessment:  Defer to OT evaluation    Lower Extremity Assessment Lower Extremity Assessment: RLE deficits/detail;LLE deficits/detail RLE Deficits / Details: Decreased sensation on foot RLE Sensation: decreased light touch LLE Deficits / Details: Decreased sensation on hallux LLE Sensation: decreased light touch    Cervical / Trunk Assessment Cervical / Trunk Assessment: Other exceptions Cervical / Trunk Exceptions: s/p back surgery.  Communication   Communication: No difficulties  Cognition Arousal/Alertness: Awake/alert Behavior During Therapy: WFL for tasks assessed/performed Overall Cognitive Status: Within Functional Limits for tasks assessed                                        General Comments General comments (skin integrity, edema, etc.): Able to donn brace with setup and cues.    Exercises     Assessment/Plan    PT Assessment Patient needs continued PT services  PT Problem List Decreased mobility;Pain;Impaired sensation;Decreased balance;Decreased skin integrity;Decreased knowledge of precautions       PT Treatment Interventions Therapeutic activities;Gait training;Stair training;Functional mobility training;Balance training;Therapeutic exercise;Patient/family education    PT Goals (Current goals can be found in the Care Plan section)  Acute Rehab PT Goals Patient Stated Goal: to go home PT Goal Formulation: With patient Time For Goal Achievement: 01/25/20 Potential to Achieve Goals: Good    Frequency Min 5X/week   Barriers to discharge        Co-evaluation               AM-PAC PT "6 Clicks" Mobility  Outcome Measure Help needed turning from your back to your side while in a flat bed without using bedrails?: None Help needed moving from lying on your back to sitting on the side of a flat bed without using bedrails?: None Help needed moving to and from a bed to a chair (including a wheelchair)?: None Help needed standing up from a chair  using your arms (e.g., wheelchair or bedside chair)?: None Help needed to walk in hospital room?: A Little Help needed climbing 3-5 steps with a railing? : A Little 6 Click Score: 22    End of Session Equipment Utilized During Treatment: Back brace Activity Tolerance: Patient tolerated treatment well Patient left: in bed;with call bell/phone within reach Nurse Communication: Mobility status PT Visit Diagnosis: Pain Pain - Right/Left: Right Pain - part of body: Leg(back)    Time: 7026-3785 PT Time Calculation (min) (ACUTE ONLY): 24 min   Charges:   PT Evaluation $PT Eval Moderate Complexity: 1 Mod PT Treatments $Gait Training: 8-22 mins        Marisa Severin, PT, DPT Acute Rehabilitation Services Pager [phone] Office [phone]      Marguarite Arbour A [place] 01/11/2020, 11:50 AM

## 2020-01-11 NOTE — Progress Notes (Signed)
Patient ID: Jade Bishop, female   DOB: 01-19-1936, 84 y.o.   MRN: [id number] Vital signs are stable Motor function is intact Dressing is clean and dry Some slowness of movement and weakness in right leg which is from preop Will monitor today Plan discharge tomorrow

## 2020-01-12 MED ORDER — DEXAMETHASONE 1 MG PO TABS: ORAL_TABLET | ORAL | 0 refills | Status: AC

## 2020-01-12 MED ORDER — HYDROCODONE-ACETAMINOPHEN 5-325 MG PO TABS: 1.0000 | ORAL_TABLET | ORAL | 0 refills | Status: AC | PRN

## 2020-01-12 MED ORDER — METHOCARBAMOL 500 MG PO TABS: 500.0000 mg | ORAL_TABLET | Freq: Four times a day (QID) | ORAL | 3 refills | Status: AC | PRN

## 2020-01-12 NOTE — Discharge Summary (Signed)
Physician Discharge Summary  Patient ID: Jade Bishop MRN: [id number] DOB/AGE: 82-Aug-1937 82 y.o.  Admit date: 01/10/2020 Discharge date: 01/12/2020  Admission Diagnoses: Lumbar stenosis with radiculopathy L3-4 and L4-5, neurogenic claudication   Discharge Diagnoses: Lumbar stenosis with radiculopathy L3-4 L4-5, neurogenic claudication Active Problems:   Lumbar stenosis with neurogenic claudication   Discharged Condition: good  Hospital Course: Patient was admitted to undergo surgical decompression and arthrodesis L3-L5.  She tolerated surgery well.  Postoperative laboratory studies revealed a slightly increased glucose of 134 slightly decreased calcium of 8.9 but minimal blood loss anemia of 11.4 hemoglobin.  No transfusions were used.  Patient is ambulatory and her incision is clean and dry.  She is discharged home  Consults: None  Significant Diagnostic Studies: None  Treatments: surgery: Laminectomy L3-4 and L4-5 decompression of L3-L4 and L5 nerve roots with posterior lumbar interbody arthrodesis and segmental fixation L3-L5.  Discharge Exam: Blood pressure (!) 131/59, pulse 80, temperature 98.3 F (36.8 C), temperature source Oral, resp. rate 19, height 5\' 2"  (1.575 m), weight 80.3 kg, SpO2 98 %. Incision is clean dry Station and gait are intact.  Disposition: Discharge disposition: 01-Home or Self Care       Discharge Instructions    Call MD for:  redness, tenderness, or signs of infection (pain, swelling, redness, odor or green/yellow discharge around incision site)   Complete by: As directed    Call MD for:  severe uncontrolled pain   Complete by: As directed    Call MD for:  temperature >100.4   Complete by: As directed    Diet - low sodium heart healthy   Complete by: As directed    Discharge instructions   Complete by: As directed    Okay to shower. Do not apply salves or appointments to incision. No heavy lifting with the upper extremities greater than 15  pounds. May resume driving when not requiring pain medication and patient feels comfortable with doing so.   Incentive spirometry RT   Complete by: As directed    Increase activity slowly   Complete by: As directed      Allergies as of 01/12/2020      Reactions   Codeine Other (See Comments)   Unknown reaction type      Medication List    STOP taking these medications   predniSONE 10 MG (48) Tbpk tablet Commonly known as: STERAPRED UNI-PAK 48 TAB     TAKE these medications   dexamethasone 1 MG tablet Commonly known as: DECADRON 2 tablets twice daily for 2 days, one tablet twice daily for 2 days, one tablet daily for 2 days.   famotidine 20 MG tablet Commonly known as: PEPCID Take 20 mg by mouth 2 (two) times daily as needed for heartburn or indigestion.   gabapentin 300 MG capsule Commonly known as: Neurontin Take 1 capsule (300 mg total) by mouth 2 (two) times daily for 10 days. What changed:   when to take this  reasons to take this   HYDROcodone-acetaminophen 5-325 MG tablet Commonly known as: NORCO/VICODIN Take 1-2 tablets by mouth every 4 (four) hours as needed for moderate pain or severe pain. What changed:   how much to take  reasons to take this   methocarbamol 500 MG tablet Commonly known as: ROBAXIN Take 1 tablet (500 mg total) by mouth every 6 (six) hours as needed for muscle spasms.   SYSTANE OP Place 1 drop into both eyes 3 (three) times daily as needed (dry/irritated eyes.).  traMADol 50 MG tablet Commonly known as: ULTRAM Take 1 tablet by mouth 2 (two) times daily as needed (pain.).        Signed: Shary Key Elsner 01/12/2020, 9:01 AM

## 2020-01-12 NOTE — Progress Notes (Signed)
Patient is discharged from room 3C11 at this time. Alert and in stable condition. IV site d/c'd and instructions read to patient and spouse with understanding verbalized and all questions answered. Left unit via wheelchair with all belongings at side. 

## 2020-01-12 NOTE — Discharge Instructions (Signed)
Wound Care Leave incision open to air. You may shower. Do not scrub directly on incision.  Do not put any creams, lotions, or ointments on incision. Activity Walk each and every day, increasing distance each day. No lifting greater than 5 lbs.  Avoid excessive neck motion. No driving for 2 weeks; may ride as a passenger locally. Diet Resume your normal diet.  Return to Work Will be discussed at you follow up appointment. Call Your Doctor If Any of These Occur Redness, drainage, or swelling at the wound.  Temperature greater than 101 degrees. Severe pain not relieved by pain medication. Increased difficulty swallowing. Incision starts to come apart. Follow Up Appt Call today for appointment in 2-4 weeks ([phone]) or for problems.   

## 2020-01-12 NOTE — Progress Notes (Signed)
Physical Therapy Treatment Patient Details Name: Jade Bishop MRN: [id number] DOB: 29-Oct-1935 Today's Date: 01/12/2020    History of Present Illness Patient is a 84 y/o female who presents s/p L3-5 PLIF 01/10/20. PMH includes sciatic and chest pain.    PT Comments    Pt continuing to progress well towards current functional mobility goals. Plan is to d/c home today with family support. Assisted pt with dressing and donning her LSO. She tolerated hallway ambulation with use of RW and supervision for safety. No LOB or need for physical assistance. Pt is ready to d/c from PT perspective.     Follow Up Recommendations  No PT follow up;Supervision - Intermittent     Equipment Recommendations  None recommended by PT    Recommendations for Other Services       Precautions / Restrictions Precautions Precautions: Back Precaution Comments: Reviewed back precautions and handout Required Braces or Orthoses: Spinal Brace Spinal Brace: Lumbar corset;Applied in sitting position Restrictions Weight Bearing Restrictions: No    Mobility  Bed Mobility Overal bed mobility: Needs Assistance Bed Mobility: Rolling;Sidelying to Sit;Sit to Sidelying Rolling: Supervision Sidelying to sit: Supervision     Sit to sidelying: Supervision General bed mobility comments: good log roll technique utilized  Transfers Overall transfer level: Needs assistance Equipment used: Rolling walker (2 wheeled) Transfers: Sit to/from Stand Sit to Stand: Supervision         General transfer comment: Supervision for safety.  Ambulation/Gait Ambulation/Gait assistance: Supervision Gait Distance (Feet): 500 Feet Assistive device: Rolling walker (2 wheeled) Gait Pattern/deviations: Step-through pattern;Decreased stride length Gait velocity: decreased   General Gait Details: Slow, steady gait with RW for support.   Stairs             Wheelchair Mobility    Modified Rankin (Stroke Patients  Only)       Balance Overall balance assessment: Mild deficits observed, not formally tested                                          Cognition Arousal/Alertness: Awake/alert Behavior During Therapy: WFL for tasks assessed/performed Overall Cognitive Status: Within Functional Limits for tasks assessed                                        Exercises      General Comments        Pertinent Vitals/Pain Pain Assessment: No/denies pain    Home Living                      Prior Function            PT Goals (current goals can now be found in the care plan section) Acute Rehab PT Goals PT Goal Formulation: With patient Time For Goal Achievement: 01/25/20 Potential to Achieve Goals: Good Progress towards PT goals: Progressing toward goals    Frequency    Min 5X/week      PT Plan Current plan remains appropriate    Co-evaluation              AM-PAC PT "6 Clicks" Mobility   Outcome Measure  Help needed turning from your back to your side while in a flat bed without using bedrails?: None Help needed moving from lying on your back to  sitting on the side of a flat bed without using bedrails?: None Help needed moving to and from a bed to a chair (including a wheelchair)?: None Help needed standing up from a chair using your arms (e.g., wheelchair or bedside chair)?: None Help needed to walk in hospital room?: None Help needed climbing 3-5 steps with a railing? : A Little 6 Click Score: 23    End of Session Equipment Utilized During Treatment: Back brace Activity Tolerance: Patient tolerated treatment well Patient left: in bed;with call bell/phone within reach Nurse Communication: Mobility status PT Visit Diagnosis: Other abnormalities of gait and mobility (R26.89)     Time: 8850-2774 PT Time Calculation (min) (ACUTE ONLY): 16 min  Charges:  $Gait Training: 8-22 mins                     Anastasio Champion,  DPT  Acute Rehabilitation Services Pager [phone] Office [place] 01/12/2020, 12:26 PM

## 2020-01-13 MED FILL — Sodium Chloride IV Soln 0.9%: INTRAVENOUS | Qty: 1000 | Status: AC

## 2020-01-13 MED FILL — Heparin Sodium (Porcine) Inj 1000 Unit/ML: INTRAMUSCULAR | Qty: 30 | Status: AC

## 2020-01-25 ENCOUNTER — Other Ambulatory Visit: Payer: Self-pay

## 2020-01-25 ENCOUNTER — Ambulatory Visit (HOSPITAL_COMMUNITY)
Admission: RE | Admit: 2020-01-25 | Discharge: 2020-01-25 | Disposition: A | Discharge status: Home / Self Care | Payer: Medicare Other | Source: Ambulatory Visit | Attending: Surgery | Admitting: Surgery

## 2020-01-25 ENCOUNTER — Other Ambulatory Visit (HOSPITAL_COMMUNITY): Payer: Self-pay | Admitting: Student

## 2020-01-25 DIAGNOSIS — R600 Localized edema: Secondary | ICD-10-CM

## 2020-02-24 ENCOUNTER — Other Ambulatory Visit: Payer: Self-pay

## 2020-02-24 ENCOUNTER — Ambulatory Visit (HOSPITAL_COMMUNITY)
Admission: RE | Admit: 2020-02-24 | Discharge: 2020-02-24 | Disposition: A | Discharge status: Home / Self Care | Payer: Medicare Other | Source: Ambulatory Visit | Attending: Vascular Surgery | Admitting: Vascular Surgery

## 2020-02-24 ENCOUNTER — Other Ambulatory Visit (HOSPITAL_COMMUNITY): Payer: Self-pay | Admitting: Neurological Surgery

## 2020-02-24 DIAGNOSIS — M7989 Other specified soft tissue disorders: Secondary | ICD-10-CM | POA: Diagnosis not present

## 2020-05-08 ENCOUNTER — Other Ambulatory Visit: Payer: Self-pay | Admitting: Family Medicine

## 2020-05-08 DIAGNOSIS — Z1231 Encounter for screening mammogram for malignant neoplasm of breast: Secondary | ICD-10-CM

## 2021-02-01 IMAGING — MG DIGITAL SCREENING BILATERAL MAMMOGRAM WITH TOMO AND CAD
8 series · 8 of 24 positions shown · non-contrast
Comparison: Previous exam(s).

CLINICAL DATA: Screening.

EXAM:
DIGITAL SCREENING BILATERAL MAMMOGRAM WITH TOMO AND CAD

[L MLO synth-2D]
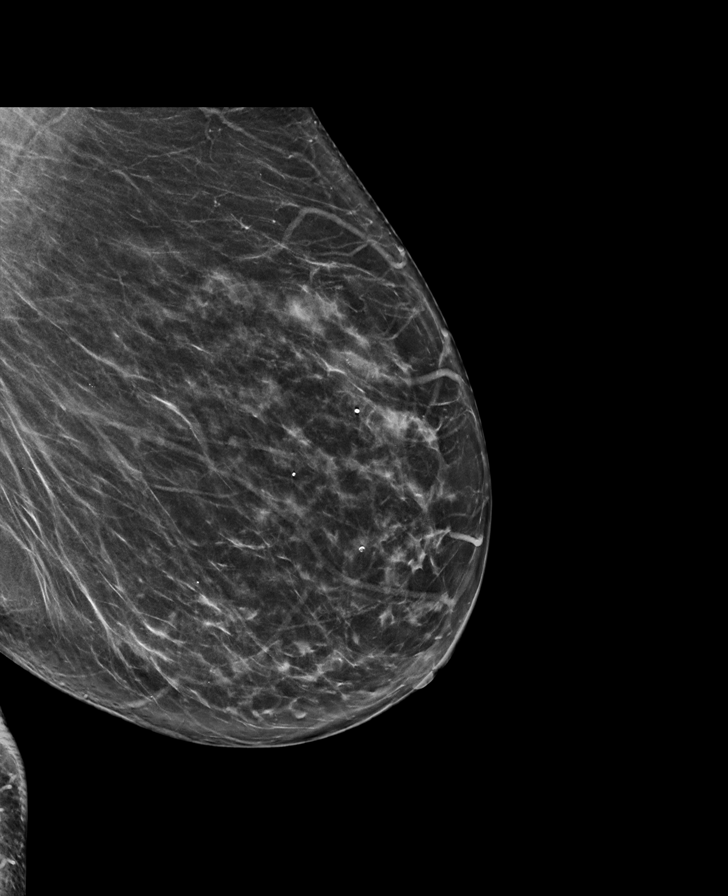

[R CC synth-2D]
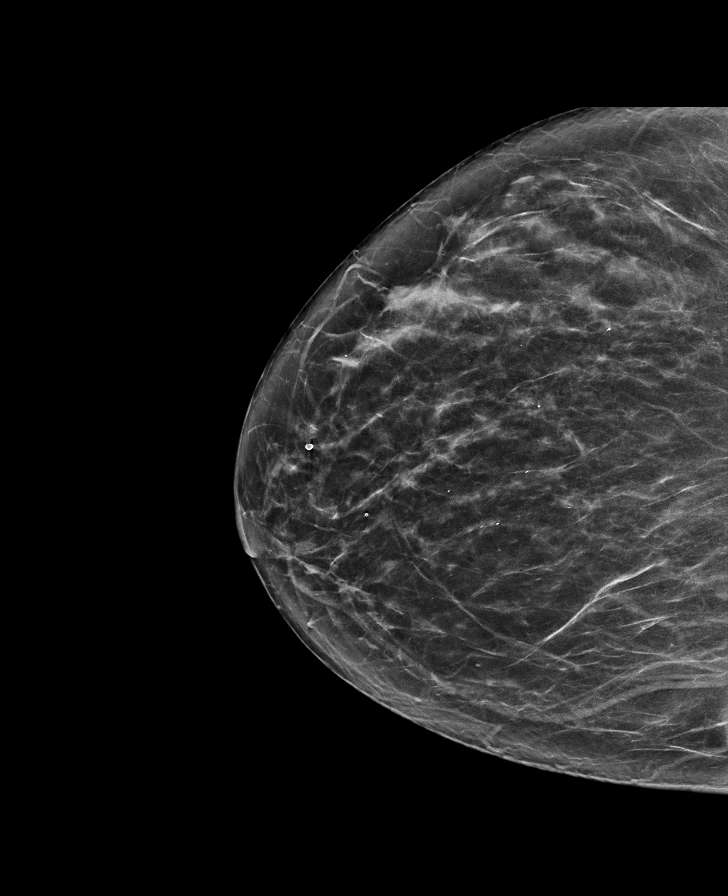

[L CC synth-2D]
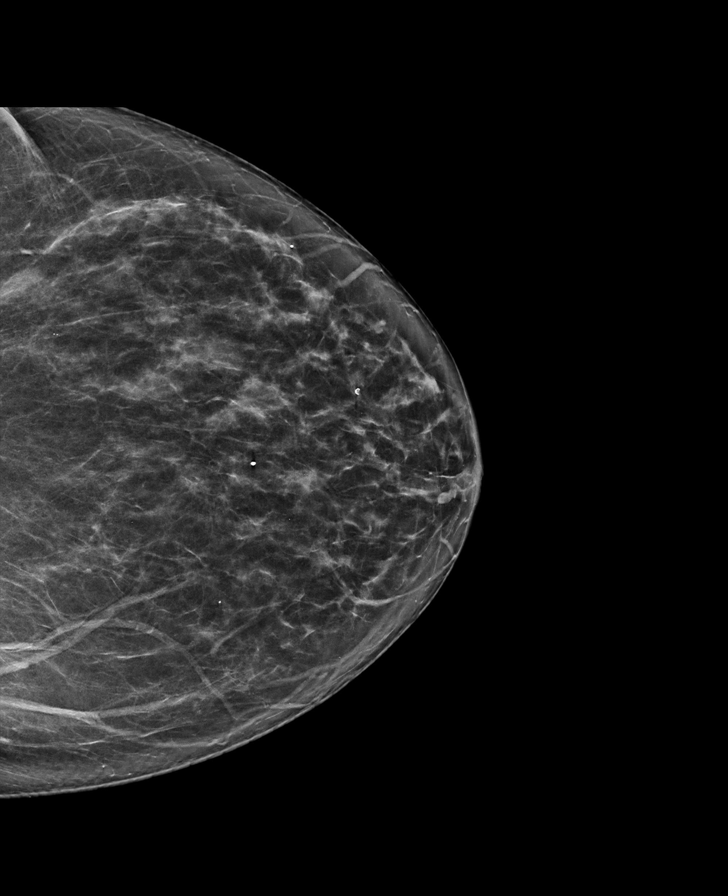

[R MLO synth-2D]
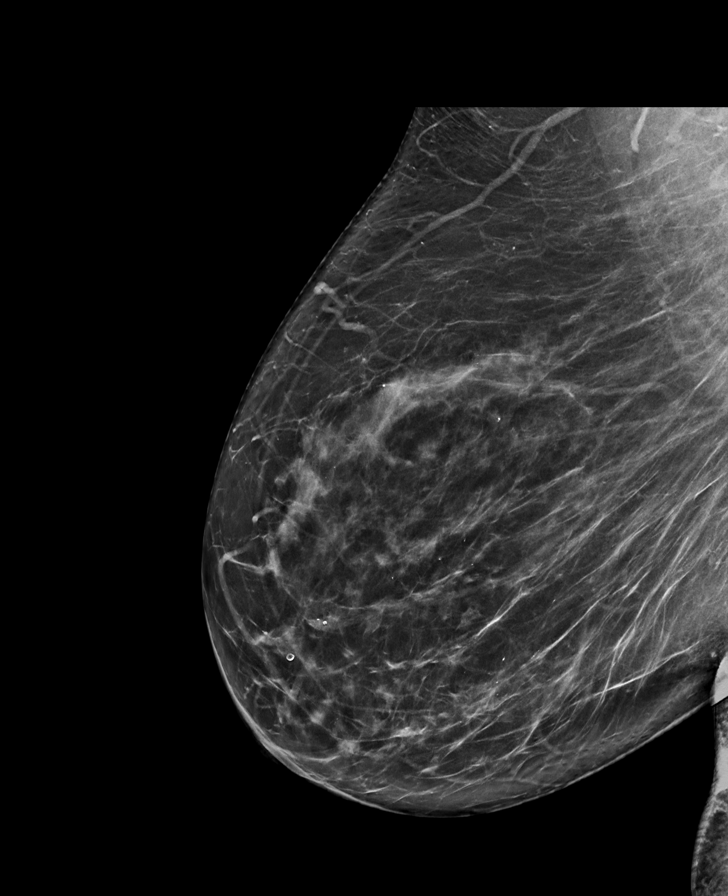

[R CC tomo · tomo slice 37/74.0]
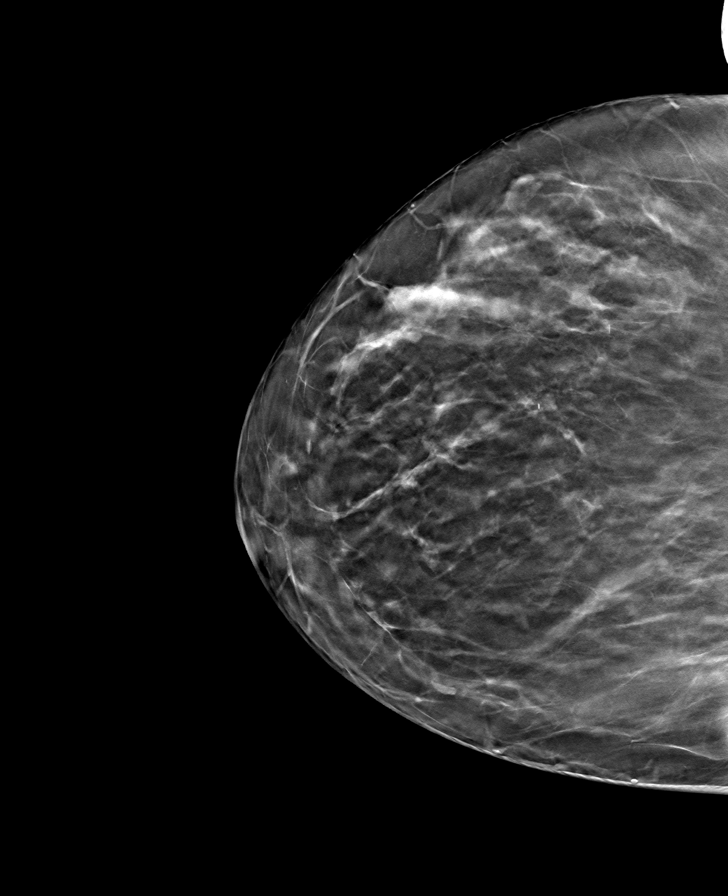

[L CC tomo · tomo slice 37/72.0]
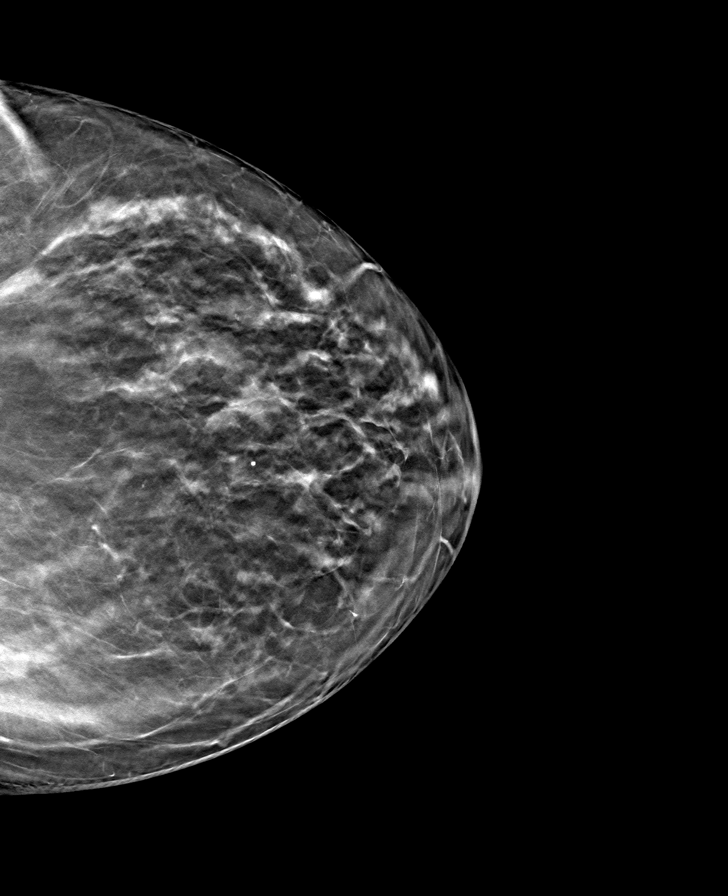

[R MLO tomo · tomo slice 42/83.0]
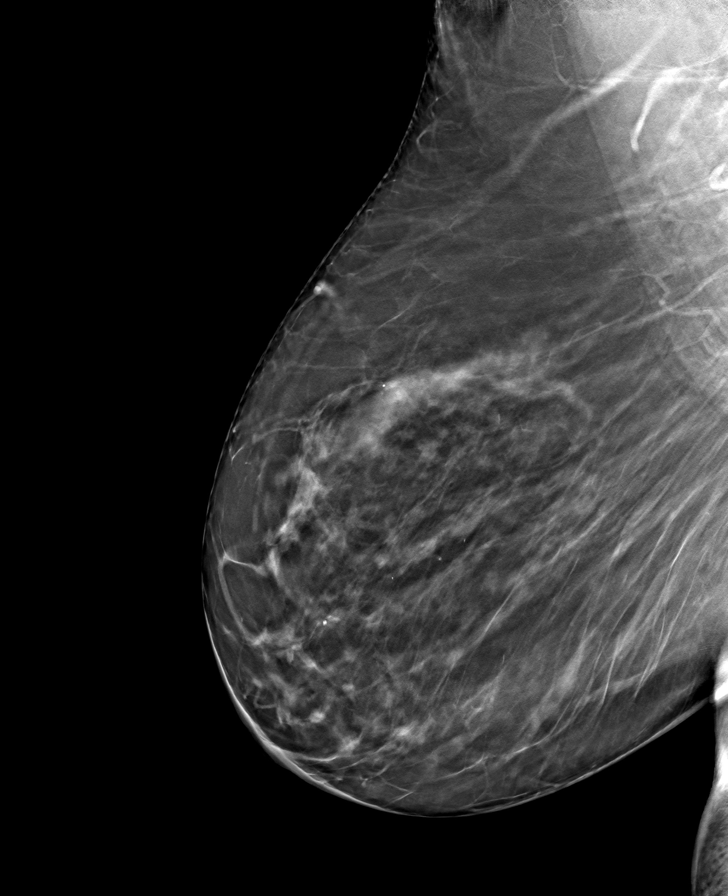

[L MLO tomo · tomo slice 37/74.0]
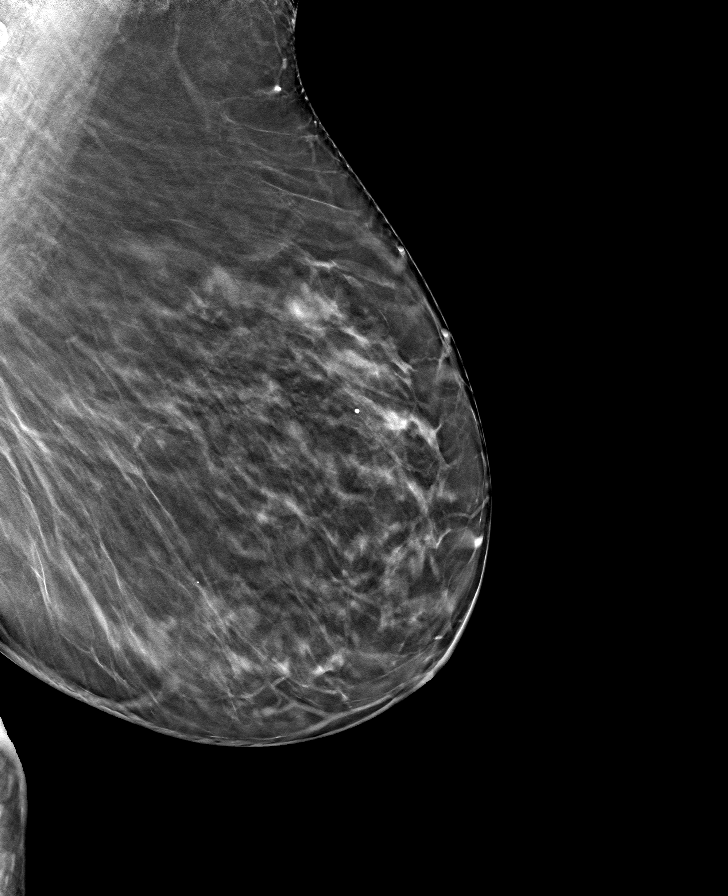

[8 of 24 positions shown; findings below may reference images not displayed]

ACR Breast Density Category b: There are scattered areas of
fibroglandular density.
FINDINGS: There are no findings suspicious for malignancy. Images were
processed with CAD.
IMPRESSION: No mammographic evidence of malignancy. A result letter of this
screening mammogram will be mailed directly to the patient.

RECOMMENDATION:
Screening mammogram in one year. (Code:[id number])

BI-RADS CATEGORY  1: Negative.

## 2021-11-25 IMAGING — RF DG LUMBAR SPINE 2-3V
1 series · 2 of 2 positions shown · non-contrast
Comparison: Localization imaging obtained earlier today. Lumbar
MRI, 12/15/2019.

CLINICAL DATA: Portable fluoroscopic imaging provided for lumbar
spine fusion surgery.

EXAM:
DG C-ARM 1-60 MIN; LUMBAR SPINE - 2-3 VIEW

[Series 1: run · 2 of 2 slices shown]
[im 1/2]
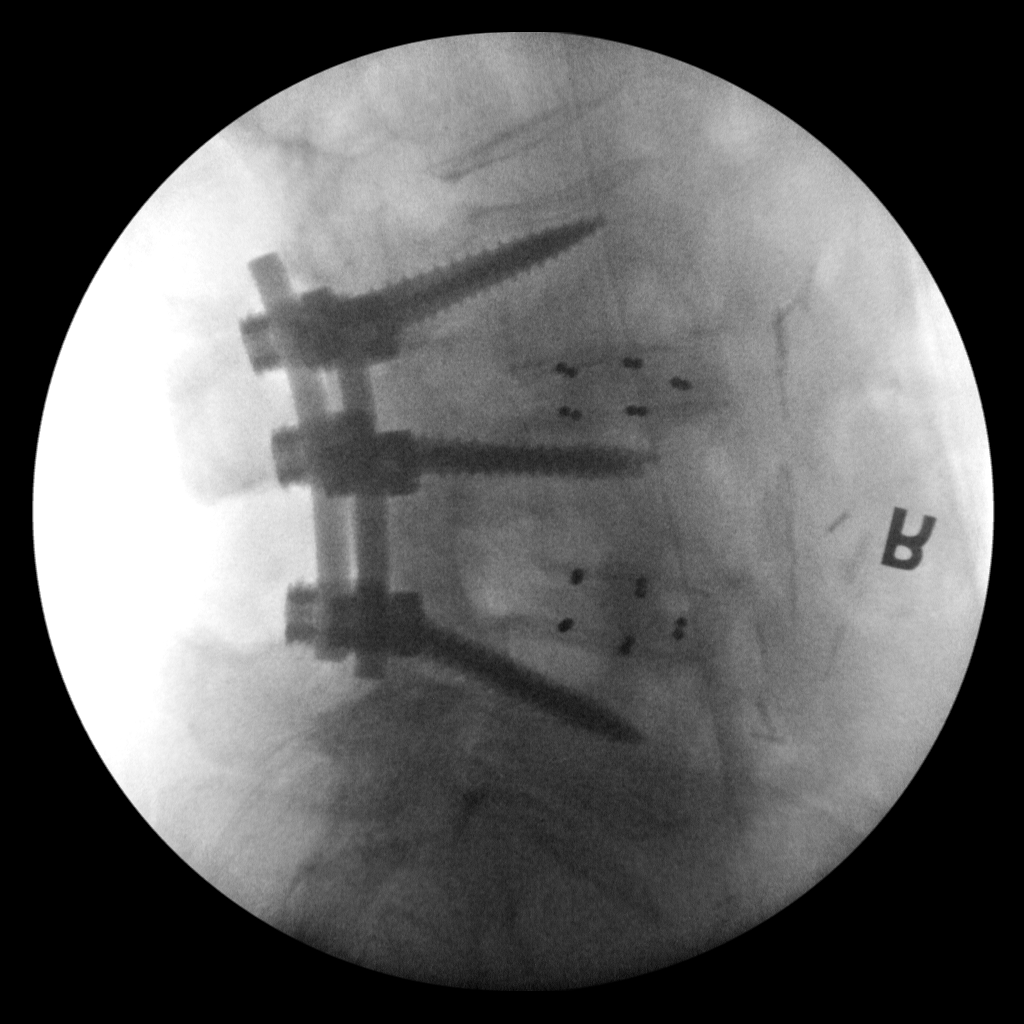
[im 2/2]
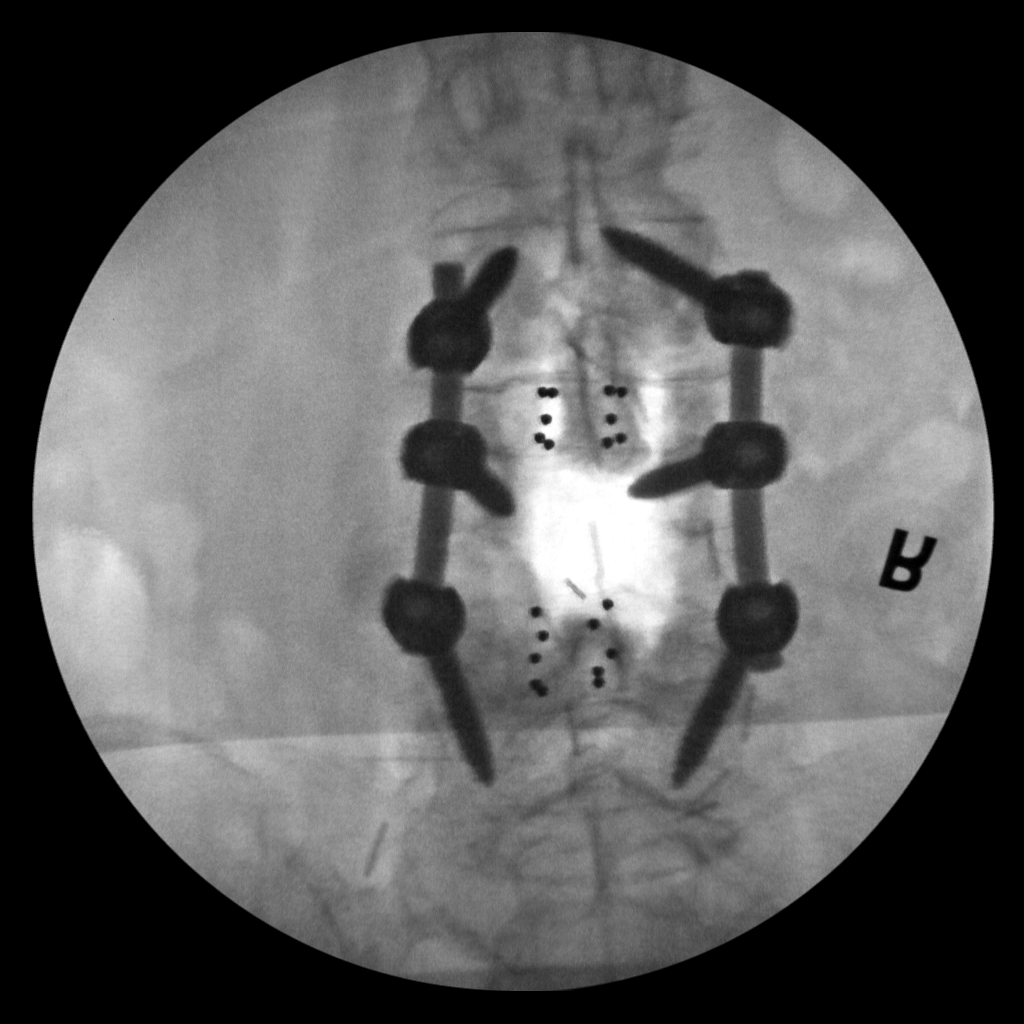

[2 of 2 positions shown; findings below may reference images not displayed]

FINDINGS: Two submitted images show placement of pedicle screws at L3, L4 on
L5, well seated and well-positioned. There are intact
interconnecting rods, and well-centered radiolucent disc spacers at
L3-L4 and L4-L5.
IMPRESSION: Well-positioned orthopedic hardware fusing L3 through L5.

## 2021-11-25 IMAGING — RF DG C-ARM 1-60 MIN
1 series · 2 of 2 positions shown · non-contrast
Comparison: Localization imaging obtained earlier today. Lumbar
MRI, 12/15/2019.

CLINICAL DATA: Portable fluoroscopic imaging provided for lumbar
spine fusion surgery.

EXAM:
DG C-ARM 1-60 MIN; LUMBAR SPINE - 2-3 VIEW

[Series 1: run · 2 of 2 slices shown]
[im 1/2]
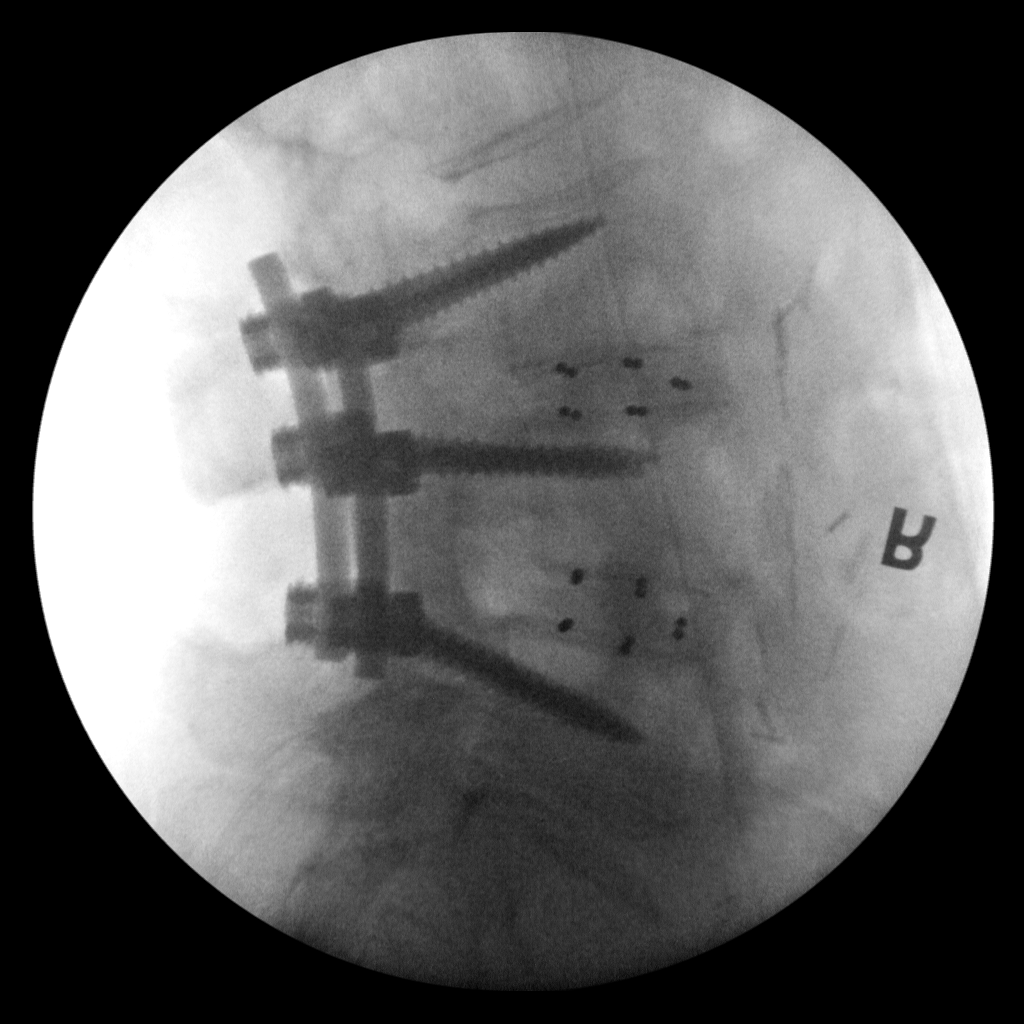
[im 2/2]
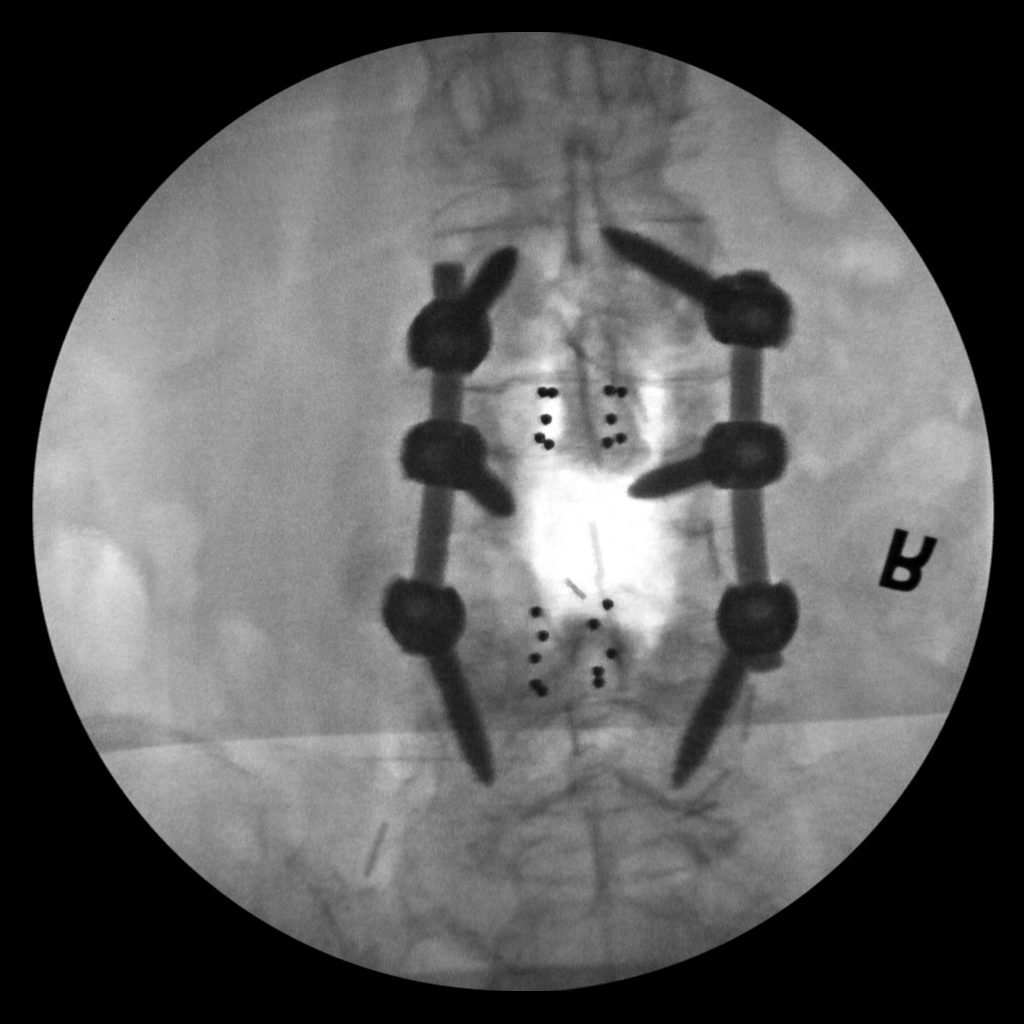

[2 of 2 positions shown; findings below may reference images not displayed]

FINDINGS: Two submitted images show placement of pedicle screws at L3, L4 on
L5, well seated and well-positioned. There are intact
interconnecting rods, and well-centered radiolucent disc spacers at
L3-L4 and L4-L5.
IMPRESSION: Well-positioned orthopedic hardware fusing L3 through L5.
